# Patient Record
Sex: Female | Born: 1960 | Race: Black or African American | Hispanic: No | Smoking: Never smoker
Health system: Southern US, Community
[De-identification: ages and names within clinical notes are randomized; demographics above are authoritative.]

## PROBLEM LIST (undated history)

## (undated) DIAGNOSIS — M1990 Unspecified osteoarthritis, unspecified site: Secondary | ICD-10-CM

## (undated) DIAGNOSIS — J45909 Unspecified asthma, uncomplicated: Secondary | ICD-10-CM

## (undated) DIAGNOSIS — D649 Anemia, unspecified: Secondary | ICD-10-CM

## (undated) DIAGNOSIS — I8390 Asymptomatic varicose veins of unspecified lower extremity: Secondary | ICD-10-CM

## (undated) DIAGNOSIS — Z889 Allergy status to unspecified drugs, medicaments and biological substances status: Secondary | ICD-10-CM

## (undated) DIAGNOSIS — I10 Essential (primary) hypertension: Secondary | ICD-10-CM

## (undated) HISTORY — PX: APPENDECTOMY: SHX54

## (undated) HISTORY — PX: OTHER SURGICAL HISTORY: SHX169

## (undated) HISTORY — PX: TONSILLECTOMY: SUR1361

---

## 2014-06-14 ENCOUNTER — Other Ambulatory Visit: Payer: Self-pay | Admitting: Orthopedic Surgery

## 2014-06-14 NOTE — Progress Notes (Signed)
Preoperative surgical orders have been place into the [facility] hospital system for Charlynne Pander on 06/14/2014, 1:59 PM  by Patrica Duel for surgery on 07-16-2014.  Preop Total Hip orders including Experel Injecion, IV Tylenol, and IV Decadron as long as there are no contraindications to the above medications. Avel Peace, PA-C

## 2014-07-04 NOTE — Patient Instructions (Signed)
[name]  07/04/2014   Your procedure is scheduled on:07/16/2014     Come into the Patient/visitor parking.  You will see at the bottom of the hill [employer] [facility].  Come thru the [facility] Entrance at 130pm.  Follow the signs to the Short Stay Center.    Call this number if you have problems the morning of surgery: [phone]   Remember:   Do not eat food after midnite.  May have clear liquids unti 0930am then npo.    Take these medicines the morning of surgery with A SIP OF WATER:    Do not wear jewelry, make-up or nail polish.  Do not wear lotions, powders, or perfumes. deodorant.  Do not shave 48 hours prior to surgery.   Do not bring valuables to the hospital.  Contacts, dentures or bridgework may not be worn into surgery.  Leave suitcase in the car. After surgery it may be brought to your room.  For patients admitted to the hospital, checkout time is 11:00 AM the day of  discharge.          Please read over the following fact sheets that you were given: MRSA Information, coughing and deep breathing exercises, leg exercises               CLEAR LIQUID DIET   Foods Allowed                                                                     Foods Excluded  Coffee and tea, regular and decaf                             liquids that you cannot  Plain Jell-O in any flavor                                             see through such as: Fruit ices (not with fruit pulp)                                     milk, soups, orange juice  Iced Popsicles                                    All solid food Carbonated beverages, regular and diet                                    Cranberry, grape and apple juices Sports drinks like Gatorade Lightly seasoned clear broth or consume(fat free) Sugar, honey syrup  Sample Menu Breakfast                                Lunch  Supper Cranberry juice                    Beef broth                             Chicken broth Jell-O                                     Grape juice                           Apple juice Coffee or tea                        Jell-O                                      Popsicle                                                Coffee or tea                        Coffee or tea  _____________________________________________________________________  [facility] - Preparing for Surgery Before surgery, you can play an important role.  Because skin is not sterile, your skin needs to be as free of germs as possible.  You can reduce the number of germs on your skin by washing with CHG (chlorahexidine gluconate) soap before surgery.  CHG is an antiseptic cleaner which kills germs and bonds with the skin to continue killing germs even after washing. Please DO NOT use if you have an allergy to CHG or antibacterial soaps.  If your skin becomes reddened/irritated stop using the CHG and inform your nurse when you arrive at Short Stay. Do not shave (including legs and underarms) for at least 48 hours prior to the first CHG shower.  You may shave your face/neck. Please follow these instructions carefully:  1.  Shower with CHG Soap the night before surgery and the  morning of Surgery.  2.  If you choose to wash your hair, wash your hair first as usual with your  normal  shampoo.  3.  After you shampoo, rinse your hair and body thoroughly to remove the  shampoo.                           4.  Use CHG as you would any other liquid soap.  You can apply chg directly  to the skin and wash                       Gently with a scrungie or clean washcloth.  5.  Apply the CHG Soap to your body ONLY FROM THE NECK DOWN.   Do not use on face/ open                           Wound or open sores. Avoid contact with eyes, ears mouth and genitals (private parts).  Wash face,  Genitals (private parts) with your normal soap.             6.  Wash thoroughly, paying special attention to the area  where your surgery  will be performed.  7.  Thoroughly rinse your body with warm water from the neck down.  8.  DO NOT shower/wash with your normal soap after using and rinsing off  the CHG Soap.                9.  Pat yourself dry with a clean towel.            10.  Wear clean pajamas.            11.  Place clean sheets on your bed the night of your first shower and do not  sleep with pets. Day of Surgery : Do not apply any lotions/deodorants the morning of surgery.  Please wear clean clothes to the hospital/surgery center.  FAILURE TO FOLLOW THESE INSTRUCTIONS MAY RESULT IN THE CANCELLATION OF YOUR SURGERY PATIENT SIGNATURE_________________________________  NURSE SIGNATURE__________________________________  ________________________________________________________________________  WHAT IS A BLOOD TRANSFUSION? Blood Transfusion Information  A transfusion is the replacement of blood or some of its parts. Blood is made up of multiple cells which provide different functions.  Red blood cells carry oxygen and are used for blood loss replacement.  White blood cells fight against infection.  Platelets control bleeding.  Plasma helps clot blood.  Other blood products are available for specialized needs, such as hemophilia or other clotting disorders. BEFORE THE TRANSFUSION  Who gives blood for transfusions?   Healthy volunteers who are fully evaluated to make sure their blood is safe. This is blood bank blood. Transfusion therapy is the safest it has ever been in the practice of medicine. Before blood is taken from a donor, a complete history is taken to make sure that person has no history of diseases nor engages in risky social behavior (examples are intravenous drug use or sexual activity with multiple partners). The donor's travel history is screened to minimize risk of transmitting infections, such as malaria. The donated blood is tested for signs of infectious diseases, such as HIV  and hepatitis. The blood is then tested to be sure it is compatible with you in order to minimize the chance of a transfusion reaction. If you or a relative donates blood, this is often done in anticipation of surgery and is not appropriate for emergency situations. It takes many days to process the donated blood. RISKS AND COMPLICATIONS Although transfusion therapy is very safe and saves many lives, the main dangers of transfusion include:   Getting an infectious disease.  Developing a transfusion reaction. This is an allergic reaction to something in the blood you were given. Every precaution is taken to prevent this. The decision to have a blood transfusion has been considered carefully by your caregiver before blood is given. Blood is not given unless the benefits outweigh the risks. AFTER THE TRANSFUSION  Right after receiving a blood transfusion, you will usually feel much better and more energetic. This is especially true if your red blood cells have gotten low (anemic). The transfusion raises the level of the red blood cells which carry oxygen, and this usually causes an energy increase.  The nurse administering the transfusion will monitor you carefully for complications. HOME CARE INSTRUCTIONS  No special instructions are needed after a transfusion. You may find your energy is better. Speak with your caregiver about any  limitations on activity for underlying diseases you may have. SEEK MEDICAL CARE IF:   Your condition is not improving after your transfusion.  You develop redness or irritation at the intravenous (IV) site. SEEK IMMEDIATE MEDICAL CARE IF:  Any of the following symptoms occur over the next 12 hours:  Shaking chills.  You have a temperature by mouth above 102 F (38.9 C), not controlled by medicine.  Chest, back, or muscle pain.  People around you feel you are not acting correctly or are confused.  Shortness of breath or difficulty breathing.  Dizziness and  fainting.  You get a rash or develop hives.  You have a decrease in urine output.  Your urine turns a dark color or changes to pink, red, or brown. Any of the following symptoms occur over the next 10 days:  You have a temperature by mouth above 102 F (38.9 C), not controlled by medicine.  Shortness of breath.  Weakness after normal activity.  The white part of the eye turns yellow (jaundice).  You have a decrease in the amount of urine or are urinating less often.  Your urine turns a dark color or changes to pink, red, or brown. Document Released: 09/11/2000 Document Revised: 12/07/2011 Document Reviewed: 04/30/2008 ExitCare Patient Information 2014 [place].  _______________________________________________________________________  Incentive Spirometer  An incentive spirometer is a tool that can help keep your lungs clear and active. This tool measures how well you are filling your lungs with each breath. Taking long deep breaths may help reverse or decrease the chance of developing breathing (pulmonary) problems (especially infection) following:  A long period of time when you are unable to move or be active. BEFORE THE PROCEDURE   If the spirometer includes an indicator to show your best effort, your nurse or respiratory therapist will set it to a desired goal.  If possible, sit up straight or lean slightly forward. Try not to slouch.  Hold the incentive spirometer in an upright position. INSTRUCTIONS FOR USE  1. Sit on the edge of your bed if possible, or sit up as far as you can in bed or on a chair. 2. Hold the incentive spirometer in an upright position. 3. Breathe out normally. 4. Place the mouthpiece in your mouth and seal your lips tightly around it. 5. Breathe in slowly and as deeply as possible, raising the piston or the ball toward the top of the column. 6. Hold your breath for 3-5 seconds or for as long as possible. Allow the piston or ball to fall to  the bottom of the column. 7. Remove the mouthpiece from your mouth and breathe out normally. 8. Rest for a few seconds and repeat Steps 1 through 7 at least 10 times every 1-2 hours when you are awake. Take your time and take a few normal breaths between deep breaths. 9. The spirometer may include an indicator to show your best effort. Use the indicator as a goal to work toward during each repetition. 10. After each set of 10 deep breaths, practice coughing to be sure your lungs are clear. If you have an incision (the cut made at the time of surgery), support your incision when coughing by placing a pillow or rolled up towels firmly against it. Once you are able to get out of bed, walk around indoors and cough well. You may stop using the incentive spirometer when instructed by your caregiver.  RISKS AND COMPLICATIONS  Take your time so you do not get dizzy  or light-headed.  If you are in pain, you may need to take or ask for pain medication before doing incentive spirometry. It is harder to take a deep breath if you are having pain. AFTER USE  Rest and breathe slowly and easily.  It can be helpful to keep track of a log of your progress. Your caregiver can provide you with a simple table to help with this. If you are using the spirometer at home, follow these instructions: [place] IF:   You are having difficultly using the spirometer.  You have trouble using the spirometer as often as instructed.  Your pain medication is not giving enough relief while using the spirometer.  You develop fever of 100.5 F (38.1 C) or higher. SEEK IMMEDIATE MEDICAL CARE IF:   You cough up bloody sputum that had not been present before.  You develop fever of 102 F (38.9 C) or greater.  You develop worsening pain at or near the incision site. MAKE SURE YOU:   Understand these instructions.  Will watch your condition.  Will get help right away if you are not doing well or get  worse. Document Released: 01/25/2007 Document Revised: 12/07/2011 Document Reviewed: 03/28/2007 [facility] Patient Information 2014 [place], [place].   ________________________________________________________________________

## 2014-07-05 ENCOUNTER — Inpatient Hospital Stay (HOSPITAL_COMMUNITY)
Admission: RE | Admit: 2014-07-05 | Discharge: 2014-07-05 | Disposition: A | Discharge status: Home / Self Care | Payer: Self-pay | Source: Ambulatory Visit

## 2014-07-11 ENCOUNTER — Encounter (HOSPITAL_COMMUNITY): Payer: Self-pay

## 2014-07-11 ENCOUNTER — Encounter (HOSPITAL_COMMUNITY): Payer: Self-pay | Admitting: Pharmacy Technician

## 2014-07-11 ENCOUNTER — Encounter (INDEPENDENT_AMBULATORY_CARE_PROVIDER_SITE_OTHER): Payer: Self-pay

## 2014-07-11 ENCOUNTER — Ambulatory Visit (HOSPITAL_COMMUNITY)
Admission: RE | Admit: 2014-07-11 | Discharge: 2014-07-11 | Disposition: A | Discharge status: Home / Self Care | Payer: BC Managed Care – PPO | Source: Ambulatory Visit | Attending: Anesthesiology | Admitting: Anesthesiology

## 2014-07-11 ENCOUNTER — Ambulatory Visit (HOSPITAL_COMMUNITY)
Admission: RE | Admit: 2014-07-11 | Discharge: 2014-07-11 | Disposition: A | Discharge status: Home / Self Care | Payer: BC Managed Care – PPO | Source: Ambulatory Visit | Attending: Orthopedic Surgery | Admitting: Orthopedic Surgery

## 2014-07-11 ENCOUNTER — Encounter (HOSPITAL_COMMUNITY)
Admission: RE | Admit: 2014-07-11 | Discharge: 2014-07-11 | Disposition: A | Discharge status: Home / Self Care | Payer: BC Managed Care – PPO | Source: Ambulatory Visit | Attending: Orthopedic Surgery | Admitting: Orthopedic Surgery

## 2014-07-11 DIAGNOSIS — I7090 Unspecified atherosclerosis: Secondary | ICD-10-CM | POA: Insufficient documentation

## 2014-07-11 DIAGNOSIS — Z01818 Encounter for other preprocedural examination: Secondary | ICD-10-CM | POA: Insufficient documentation

## 2014-07-11 DIAGNOSIS — M169 Osteoarthritis of hip, unspecified: Secondary | ICD-10-CM | POA: Diagnosis not present

## 2014-07-11 DIAGNOSIS — I10 Essential (primary) hypertension: Secondary | ICD-10-CM

## 2014-07-11 HISTORY — DX: Asymptomatic varicose veins of unspecified lower extremity: I83.90

## 2014-07-11 HISTORY — DX: Unspecified osteoarthritis, unspecified site: M19.90

## 2014-07-11 HISTORY — DX: Allergy status to unspecified drugs, medicaments and biological substances: Z88.9

## 2014-07-11 HISTORY — DX: Anemia, unspecified: D64.9

## 2014-07-11 HISTORY — DX: Essential (primary) hypertension: I10

## 2014-07-11 LAB — HCG, SERUM, QUALITATIVE: PREG SERUM: NEGATIVE

## 2014-07-11 LAB — CBC
HCT: 33.4 % — ABNORMAL LOW (ref 36.0–46.0)
Hemoglobin: 10.2 g/dL — ABNORMAL LOW (ref 12.0–15.0)
MCH: 27.1 pg (ref 26.0–34.0)
MCHC: 30.5 g/dL (ref 30.0–36.0)
MCV: 88.6 fL (ref 78.0–100.0)
PLATELETS: 404 10*3/uL — AB (ref 150–400)
RBC: 3.77 MIL/uL — AB (ref 3.87–5.11)
RDW: 14.1 % (ref 11.5–15.5)
WBC: 8.1 10*3/uL (ref 4.0–10.5)

## 2014-07-11 LAB — COMPREHENSIVE METABOLIC PANEL
ALT: 10 U/L (ref 0–35)
AST: 22 U/L (ref 0–37)
Albumin: 3.7 g/dL (ref 3.5–5.2)
Alkaline Phosphatase: 70 U/L (ref 39–117)
Anion gap: 12 (ref 5–15)
BUN: 17 mg/dL (ref 6–23)
CALCIUM: 9.5 mg/dL (ref 8.4–10.5)
CO2: 26 meq/L (ref 19–32)
Chloride: 102 mEq/L (ref 96–112)
Creatinine, Ser: 0.88 mg/dL (ref 0.50–1.10)
GFR calc Af Amer: 85 mL/min — ABNORMAL LOW (ref >90)
GFR calc non Af Amer: 73 mL/min — ABNORMAL LOW (ref >90)
Glucose, Bld: 91 mg/dL (ref 70–99)
Potassium: 4 mEq/L (ref 3.7–5.3)
SODIUM: 140 meq/L (ref 137–147)
TOTAL PROTEIN: 7.9 g/dL (ref 6.0–8.3)
Total Bilirubin: 0.3 mg/dL (ref 0.3–1.2)

## 2014-07-11 LAB — URINALYSIS, ROUTINE W REFLEX MICROSCOPIC
Bilirubin Urine: NEGATIVE
Glucose, UA: NEGATIVE mg/dL
KETONES UR: NEGATIVE mg/dL
NITRITE: NEGATIVE
PROTEIN: NEGATIVE mg/dL
Specific Gravity, Urine: 1.017 (ref 1.005–1.030)
Urobilinogen, UA: 0.2 mg/dL (ref 0.0–1.0)
pH: 6 (ref 5.0–8.0)

## 2014-07-11 LAB — PROTIME-INR
INR: 1.11 (ref 0.00–1.49)
Prothrombin Time: 14.4 seconds (ref 11.6–15.2)

## 2014-07-11 LAB — URINE MICROSCOPIC-ADD ON

## 2014-07-11 LAB — APTT: aPTT: 28 seconds (ref 24–37)

## 2014-07-11 LAB — SURGICAL PCR SCREEN
MRSA, PCR: NEGATIVE
STAPHYLOCOCCUS AUREUS: POSITIVE — AB

## 2014-07-11 LAB — ABO/RH: ABO/RH(D): O POS

## 2014-07-11 NOTE — Pre-Procedure Instructions (Signed)
PT'S PCR REPORT AND URINALYSIS REPORTS FAXED TO DR. ALUISIO'S OFFICE.

## 2014-07-11 NOTE — Pre-Procedure Instructions (Signed)
EKG, CXR [facility]

## 2014-07-11 NOTE — Patient Instructions (Addendum)
[facility]  ON:  Monday  10/19  REPORT TO  SHORT STAY CENTER AT:  1:25 PM    DO NOT EAT  ANYTHING AFTER MIDNIGHT THE NIGHT BEFORE YOUR SURGERY.   NO FOOD, NO CHEWING GUM, NO MINTS, NO CANDIES, NO CHEWING TOBACCO. YOU MAY HAVE CLEAR LIQUIDS TO DRINK FROM MIDNIGHT UNTIL 10:30 AM DAY OF YOUR SURGERY - LIKE WATER, SODA, APPLE, GRAPE OR CRANBERRY JUICE.                                                  NOTHING TO DRINK AFTER 10:30 AM THE DAY OF YOUR SURGERY.  PLEASE TAKE THE FOLLOWING MEDICATIONS THE AM OF YOUR SURGERY WITH A FEW SIPS OF WATER:  TRAMADOL FOR PAIN IF NEEDED    DO NOT BRING VALUABLES, MONEY, CREDIT CARDS.  DO NOT WEAR JEWELRY, MAKE-UP, NAIL POLISH AND NO METAL PINS OR CLIPS IN YOUR HAIR. CONTACT LENS, DENTURES / PARTIALS, GLASSES SHOULD NOT BE WORN TO SURGERY AND IN MOST CASES-HEARING AIDS WILL NEED TO BE REMOVED.  BRING YOUR GLASSES CASE, ANY EQUIPMENT NEEDED FOR YOUR CONTACT LENS. FOR PATIENTS ADMITTED TO THE HOSPITAL--CHECK OUT TIME THE DAY OF DISCHARGE IS 11:00 AM.  ALL INPATIENT ROOMS ARE PRIVATE - WITH BATHROOM, TELEPHONE, TELEVISION AND WIFI INTERNET.    PLEASE BE AWARE THAT YOU MAY NEED ADDITIONAL BLOOD DRAWN DAY OF YOUR SURGERY  _______________________________________________________________________   [facility] - Preparing for [facility] Before surgery, you can play an important role.  Because skin is not sterile, your skin needs to be as free of germs as possible.  You can reduce the number of germs on your skin by washing with CHG (chlorahexidine gluconate) soap before surgery.  CHG is an antiseptic cleaner which kills germs and bonds with the skin to continue killing germs even after washing. Please DO NOT use if you have an allergy to CHG or antibacterial soaps.  If your skin becomes reddened/irritated stop using the CHG and inform your nurse when you arrive at Short Stay. Do not shave (including legs and underarms) for  at least 48 hours prior to the first CHG shower.  You may shave your face/neck. Please follow these instructions carefully:  1.  Shower with CHG Soap the night before surgery and the  morning of Surgery.  2.  If you choose to wash your hair, wash your hair first as usual with your  normal  shampoo.  3.  After you shampoo, rinse your hair and body thoroughly to remove the  shampoo.                           4.  Use CHG as you would any other liquid soap.  You can apply chg directly  to the skin and wash                       Gently with a scrungie or clean washcloth.  5.  Apply the CHG Soap to your body ONLY FROM THE NECK DOWN.   Do not use on face/ open                           Wound or open sores. Avoid contact with  eyes, ears mouth and genitals (private parts).                       Wash face,  Genitals (private parts) with your normal soap.             6.  Wash thoroughly, paying special attention to the area where your surgery  will be performed.  7.  Thoroughly rinse your body with warm water from the neck down.  8.  DO NOT shower/wash with your normal soap after using and rinsing off  the CHG Soap.                9.  Pat yourself dry with a clean towel.            10.  Wear clean pajamas.            11.  Place clean sheets on your bed the night of your first shower and do not  sleep with pets. Day of Surgery : Do not apply any lotions/deodorants the morning of surgery.  Please wear clean clothes to the hospital/surgery center.  FAILURE TO FOLLOW THESE INSTRUCTIONS MAY RESULT IN THE CANCELLATION OF YOUR SURGERY PATIENT SIGNATURE_________________________________  NURSE SIGNATURE__________________________________  ________________________________________________________________________   [name]  An incentive spirometer is a tool that can help keep your lungs clear and active. This tool measures how well you are filling your lungs with each breath. Taking long deep  breaths may help reverse or decrease the chance of developing breathing (pulmonary) problems (especially infection) following:  A long period of time when you are unable to move or be active. BEFORE THE PROCEDURE   If the spirometer includes an indicator to show your best effort, your nurse or respiratory therapist will set it to a desired goal.  If possible, sit up straight or lean slightly forward. Try not to slouch.  Hold the incentive spirometer in an upright position. INSTRUCTIONS FOR USE  1. Sit on the edge of your bed if possible, or sit up as far as you can in bed or on a chair. 2. Hold the incentive spirometer in an upright position. 3. Breathe out normally. 4. Place the mouthpiece in your mouth and seal your lips tightly around it. 5. Breathe in slowly and as deeply as possible, raising the piston or the ball toward the top of the column. 6. Hold your breath for 3-5 seconds or for as long as possible. Allow the piston or ball to fall to the bottom of the column. 7. Remove the mouthpiece from your mouth and breathe out normally. 8. Rest for a few seconds and repeat Steps 1 through 7 at least 10 times every 1-2 hours when you are awake. Take your time and take a few normal breaths between deep breaths. 9. The spirometer may include an indicator to show your best effort. Use the indicator as a goal to work toward during each repetition. 10. After each set of 10 deep breaths, practice coughing to be sure your lungs are clear. If you have an incision (the cut made at the time of surgery), support your incision when coughing by placing a pillow or rolled up towels firmly against it. Once you are able to get out of bed, walk around indoors and cough well. You may stop using the incentive spirometer when instructed by your caregiver.  RISKS AND COMPLICATIONS  Take your time so you do not get dizzy or light-headed.  If you are  in pain, you may need to take or ask for pain medication before  doing incentive spirometry. It is harder to take a deep breath if you are having pain. AFTER USE  Rest and breathe slowly and easily.  It can be helpful to keep track of a log of your progress. Your caregiver can provide you with a simple table to help with this. If you are using the spirometer at home, follow these instructions: SEEK MEDICAL CARE IF:   You are having difficultly using the spirometer.  You have trouble using the spirometer as often as instructed.  Your pain medication is not giving enough relief while using the spirometer.  You develop fever of 100.5 F (38.1 C) or higher. SEEK IMMEDIATE MEDICAL CARE IF:   You cough up bloody sputum that had not been present before.  You develop fever of 102 F (38.9 C) or greater.  You develop worsening pain at or near the incision site. MAKE SURE YOU:   Understand these instructions.  Will watch your condition.  Will get help right away if you are not doing well or get worse. Document Released: 01/25/2007 Document Revised: 12/07/2011 Document Reviewed: 03/28/2007 Exit[employer] Patient Information 2014 [employer], [place].   ________________________________________________________________________  WHAT IS A BLOOD TRANSFUSION? Blood Transfusion Information  A transfusion is the replacement of blood or some of its parts. Blood is made up of multiple cells which provide different functions.  Red blood cells carry oxygen and are used for blood loss replacement.  White blood cells fight against infection.  Platelets control bleeding.  Plasma helps clot blood.  Other blood products are available for specialized needs, such as hemophilia or other clotting disorders. BEFORE THE TRANSFUSION  Who gives blood for transfusions?   Healthy volunteers who are fully evaluated to make sure their blood is safe. This is blood bank blood. Transfusion therapy is the safest it has ever been in the practice of medicine. Before blood is taken  from a donor, a complete history is taken to make sure that person has no history of diseases nor engages in risky social behavior (examples are intravenous drug use or sexual activity with multiple partners). The donor's travel history is screened to minimize risk of transmitting infections, such as malaria. The donated blood is tested for signs of infectious diseases, such as HIV and hepatitis. The blood is then tested to be sure it is compatible with you in order to minimize the chance of a transfusion reaction. If you or a relative donates blood, this is often done in anticipation of surgery and is not appropriate for emergency situations. It takes many days to process the donated blood. RISKS AND COMPLICATIONS Although transfusion therapy is very safe and saves many lives, the main dangers of transfusion include:   Getting an infectious disease.  Developing a transfusion reaction. This is an allergic reaction to something in the blood you were given. Every precaution is taken to prevent this. The decision to have a blood transfusion has been considered carefully by your caregiver before blood is given. Blood is not given unless the benefits outweigh the risks. AFTER THE TRANSFUSION  Right after receiving a blood transfusion, you will usually feel much better and more energetic. This is especially true if your red blood cells have gotten low (anemic). The transfusion raises the level of the red blood cells which carry oxygen, and this usually causes an energy increase.  The nurse administering the transfusion will monitor you carefully for complications. HOME CARE  INSTRUCTIONS  No special instructions are needed after a transfusion. You may find your energy is better. Speak with your caregiver about any limitations on activity for underlying diseases you may have. SEEK MEDICAL CARE IF:   Your condition is not improving after your transfusion.  You develop redness or irritation at the  intravenous (IV) site. SEEK IMMEDIATE MEDICAL CARE IF:  Any of the following symptoms occur over the next 12 hours:  Shaking chills.  You have a temperature by mouth above 102 F (38.9 C), not controlled by medicine.  Chest, back, or muscle pain.  People around you feel you are not acting correctly or are confused.  Shortness of breath or difficulty breathing.  Dizziness and fainting.  You get a rash or develop hives.  You have a decrease in urine output.  Your urine turns a dark color or changes to pink, red, or brown. Any of the following symptoms occur over the next 10 days:  You have a temperature by mouth above 102 F (38.9 C), not controlled by medicine.  Shortness of breath.  Weakness after normal activity.  The white part of the eye turns yellow (jaundice).  You have a decrease in the amount of urine or are urinating less often.  Your urine turns a dark color or changes to pink, red, or brown. Document Released: 09/11/2000 Document Revised: 12/07/2011 Document Reviewed: 04/30/2008 [facility] Patient Information 2014 [place].  _______________________________________________________________________

## 2014-07-15 ENCOUNTER — Other Ambulatory Visit: Payer: Self-pay | Admitting: Orthopedic Surgery

## 2014-07-15 NOTE — H&P (Signed)
[name] DOB: 08-08-1961 Married / Language: [place] P[place] / Race: Black or African American, White Female Date of Admission:  07/16/2014 Chief Complaint:  Left Hip Pain History of Present Illness  The patient is a 53 year old female who comes in for a preoperative History and Physical. The patient is scheduled for a left total hip arthroplasty to be performed by Dr. Gus RankinFrank V. Aluisio, MD at [facility] on 07/16/2014. The patient is a 53 year old female who presents with a hip problem. The patient was seen in referral from Dr. [name]. The patient reports left hip problems including pain and stiffness symptoms that have been present for 4 month(s). The symptoms began following a specific injury. The injury occured due to a fall (slipped on a dryer sheet) while the patient was at home. Symptoms reported include hip pain, pain with weightbearing, night pain, stiffness, popping, difficulty bearing weight and difficulty ambulating The patient reports symptoms radiating to the: left thigh. Onset of symptoms was gradual.The symptoms are described as moderate in severity.The patient feels as if their symptoms are does feel they are worsening. Symptoms are exacerbated by movement, squatting and weight bearing. Associated symptoms do not include low back pain. Current treatment includes nonsteroidal anti-inflammatory drugs (Advil) and non-opioid analgesics (Tramadol). Previous workup for this problem has included hip x-rays and hip MRI. Previous treatment for this problem has included corticosteroid injection (she is 4 weeks out from intra-articular injection by Dr. [name], only slightly improved after the injection). The hip is getting progressively worse every day according to the patient. She is hurting at all times. It is limiting what she can and cannot do. She says she cannot walk without severe pain. She is basically just sitting down and not doing much activity. She has had significant limitations  in movement as well as her pain. She has a rapidly progressive osteoarthritis. At this point I would then recommend total hip arthroplasty as she is having considerable discomfort and dysfunction. She is ready to proceed. They have been treated conservatively in the past for the above stated problem and despite conservative measures, they continue to have progressive pain and severe functional limitations and dysfunction. They have failed non-operative management including home exercise, medications. It is felt that they would benefit from undergoing total joint replacement. Risks and benefits of the procedure have been discussed with the patient and they elect to proceed with surgery. There are no active contraindications to surgery such as ongoing infection or rapidly progressive neurological disease.  Allergies Penicillamine *ASSORTED CLASSES* Rash  Problem List/Past Medical Primary osteoarthritis of left hip (M16.12) Anemia Asthma Childhood Bronchitis Past History Hypertension Mild Measles Mumps Chicken Pox  Family History Diabetes Mellitus mother Drug / Alcohol Addiction brother Hypertension mother, sister and grandfather mothers side Cerebrovascular Accident sister Cancer father Congestive Heart Failure grandfather mothers side Osteoporosis mother Heart Disease grandfather mothers side Osteoarthritis mother Rheumatoid Arthritis mother  Social History Tobacco use never smoker Pain Contract no Illicit drug use no Number of flights of stairs before winded 4-5 Marital status married Living situation live with spouse Drug/Alcohol Rehab (Previously) no Exercise does running / walking Current work status working full time Children 3 Drug/Alcohol Rehab (Currently) no Post-Surgical Plans Home with Family  Medication History Advil (200MG  Tablet, Oral) Active. Hydrochlorothiazide (Oral) Specific dose unknown - Active. Cetirizine HCl (10MG   Tablet Chewable, Oral) Active. Fluticasone Propionate Active. Vitamin D (1000UNIT Capsule, Oral) Active. Calcium (500MG  Tablet, Oral) Active. TraMADol HCl (50MG  Tablet, Oral) Active.  Past Surgical History  Appendectomy Cesarean Delivery 3 or more times Tonsillectomy   Review of Systems General Not Present- Chills, Fatigue, Fever, Memory Loss, Night Sweats, Weight Gain and Weight Loss. Skin Not Present- Eczema, Hives, Itching, Lesions and Rash. HEENT Not Present- Dentures, Double Vision, Headache, Hearing Loss, Tinnitus and Visual Loss. Respiratory Not Present- Allergies, Chronic Cough, Coughing up blood, Shortness of breath at rest and Shortness of breath with exertion. Cardiovascular Not Present- Chest Pain, Difficulty Breathing Lying Down, Murmur, Palpitations, Racing/skipping heartbeats and Swelling. Gastrointestinal Present- Diarrhea. Not Present- Abdominal Pain, Bloody Stool, Constipation, Difficulty Swallowing, Heartburn, Jaundice, Loss of appetitie, Nausea and Vomiting. Female Genitourinary Not Present- Blood in Urine, Discharge, Flank Pain, Incontinence, Painful Urination, Urgency, Urinary frequency, Urinary Retention, Urinating at Night and Weak urinary stream. Musculoskeletal Present- Joint Pain. Not Present- Back Pain, Joint Swelling, Morning Stiffness, Muscle Pain, Muscle Weakness and Spasms. Neurological Not Present- Blackout spells, Difficulty with balance, Dizziness, Paralysis, Tremor and Weakness. Psychiatric Not Present- Insomnia.   Vitals  BP: 148/102 (Sitting, Right Arm, Standard)   Physical Exam  General Mental Status -Alert, cooperative and good historian. General Appearance-pleasant, Not in acute distress. Orientation-Oriented X3. Build & Nutrition-Well nourished and Well developed.  Head and Neck Head-normocephalic, atraumatic . Neck Global Assessment - supple, no bruit auscultated on the right, no bruit auscultated on the  left.  Eye Vision-Wears corrective lenses. Pupil - Bilateral-PERR Motion - Bilateral-EOMI.  Chest and Lung Exam Auscultation Breath sounds - clear at anterior chest wall and clear at posterior chest wall. Adventitious sounds - No Adventitious sounds.  Cardiovascular Auscultation Rhythm - Regular rate and rhythm. Heart Sounds - S1 WNL and S2 WNL. Murmurs & Other Heart Sounds - Auscultation of the heart reveals - No Murmurs.  Abdomen Palpation/Percussion Tenderness - Abdomen is non-tender to palpation. Rigidity (guarding) - Abdomen is soft. Auscultation Auscultation of the abdomen reveals - Bowel sounds normal.  Female Genitourinary Note: Not done, not pertinent to present illness   Musculoskeletal Note: On exam, well developed female. She is morbidly obese. She is alert and oriented in no apparent distress. She has active range of motion right hip with 120 flexion, rotation in 30, out 40, and abducted 40 without discomfort. Left hip flexion 90. Minimal internal rotation, about 20 abduction and 20 external rotation. Her knee exam is normal. Pulse, sensation, and motor intact.  RADIOGRAPHS: Radiographs are reviewed from earlier this year. She did have significant joint space narrowing of the hip at that point. We have reviewed her MRI. She has bone on bone change now. I also reviewed the x-ray when she had her last intraarticular injection. It shows bone on bone now.  Assessment & Plan  Primary osteoarthritis of left hip (M16.12) Note:Plan is for a Left Total Hip Replacement by Dr. Lequita HaltAluisio.  Plan is to go home with family.  [facility]  The patient does not have any contraindications and will receive TXA (tranexamic acid) prior to surgery.  Signed electronically by Lauraine RinneAlexzandrew L Perkins, III PA-C

## 2014-07-16 ENCOUNTER — Encounter (HOSPITAL_COMMUNITY): Payer: Self-pay | Admitting: *Deleted

## 2014-07-16 ENCOUNTER — Inpatient Hospital Stay (HOSPITAL_COMMUNITY): Payer: BC Managed Care – PPO

## 2014-07-16 ENCOUNTER — Inpatient Hospital Stay (HOSPITAL_COMMUNITY): Payer: BC Managed Care – PPO | Admitting: Anesthesiology

## 2014-07-16 ENCOUNTER — Encounter (HOSPITAL_COMMUNITY)
Admission: RE | Disposition: A | Discharge status: Skilled Nursing Facility | Payer: Self-pay | Source: Ambulatory Visit | Attending: Orthopedic Surgery

## 2014-07-16 ENCOUNTER — Inpatient Hospital Stay (HOSPITAL_COMMUNITY)
Admission: RE | Admit: 2014-07-16 | Discharge: 2014-07-20 | DRG: 470 | Disposition: A | Discharge status: Skilled Nursing Facility | Payer: BC Managed Care – PPO | Source: Ambulatory Visit | Attending: Orthopedic Surgery | Admitting: Orthopedic Surgery

## 2014-07-16 ENCOUNTER — Encounter (HOSPITAL_COMMUNITY): Payer: BC Managed Care – PPO | Admitting: Anesthesiology

## 2014-07-16 DIAGNOSIS — Z6841 Body Mass Index (BMI) 40.0 and over, adult: Secondary | ICD-10-CM | POA: Diagnosis not present

## 2014-07-16 DIAGNOSIS — M1612 Unilateral primary osteoarthritis, left hip: Principal | ICD-10-CM | POA: Diagnosis present

## 2014-07-16 DIAGNOSIS — I10 Essential (primary) hypertension: Secondary | ICD-10-CM | POA: Diagnosis present

## 2014-07-16 DIAGNOSIS — M169 Osteoarthritis of hip, unspecified: Secondary | ICD-10-CM | POA: Diagnosis present

## 2014-07-16 DIAGNOSIS — M25552 Pain in left hip: Secondary | ICD-10-CM | POA: Diagnosis present

## 2014-07-16 HISTORY — PX: TOTAL HIP ARTHROPLASTY: SHX124

## 2014-07-16 LAB — TYPE AND SCREEN
ABO/RH(D): O POS
ANTIBODY SCREEN: NEGATIVE

## 2014-07-16 SURGERY — ARTHROPLASTY, HIP, TOTAL,POSTERIOR APPROACH
Anesthesia: General | Site: Hip | Laterality: Left

## 2014-07-16 MED ORDER — OXYCODONE HCL 5 MG PO TABS
5.0000 mg | ORAL_TABLET | ORAL | Status: DC | PRN
  Administered 2014-07-16 – 2014-07-18 (×11): 10 mg via ORAL
  Filled 2014-07-16 (×11): qty 2

## 2014-07-16 MED ORDER — ONDANSETRON HCL 4 MG PO TABS: 4.0000 mg | ORAL_TABLET | Freq: Four times a day (QID) | ORAL | Status: DC | PRN

## 2014-07-16 MED ORDER — MUPIROCIN 2 % EX OINT: 1.0000 "application " | TOPICAL_OINTMENT | Freq: Once | CUTANEOUS | Status: DC

## 2014-07-16 MED ORDER — BUPIVACAINE LIPOSOME 1.3 % IJ SUSP
INTRAMUSCULAR | Status: DC | PRN
  Administered 2014-07-16: 20 mL

## 2014-07-16 MED ORDER — METHOCARBAMOL 1000 MG/10ML IJ SOLN
500.0000 mg | Freq: Four times a day (QID) | INTRAMUSCULAR | Status: DC | PRN
  Administered 2014-07-16: 500 mg via INTRAVENOUS
  Filled 2014-07-16: qty 5

## 2014-07-16 MED ORDER — SODIUM CHLORIDE 0.9 % IJ SOLN
INTRAMUSCULAR | Status: AC
  Filled 2014-07-16: qty 10

## 2014-07-16 MED ORDER — ACETAMINOPHEN 650 MG RE SUPP: 650.0000 mg | Freq: Four times a day (QID) | RECTAL | Status: DC | PRN

## 2014-07-16 MED ORDER — BUPIVACAINE HCL (PF) 0.25 % IJ SOLN
INTRAMUSCULAR | Status: AC
  Filled 2014-07-16: qty 30

## 2014-07-16 MED ORDER — HYDROMORPHONE HCL 2 MG/ML IJ SOLN
INTRAMUSCULAR | Status: AC
  Filled 2014-07-16: qty 1

## 2014-07-16 MED ORDER — CHLORHEXIDINE GLUCONATE 4 % EX LIQD: 60.0000 mL | Freq: Once | CUTANEOUS | Status: DC

## 2014-07-16 MED ORDER — RIVAROXABAN 10 MG PO TABS
10.0000 mg | ORAL_TABLET | Freq: Every day | ORAL | Status: DC
  Administered 2014-07-17 – 2014-07-20 (×4): 10 mg via ORAL
  Filled 2014-07-16 (×7): qty 1

## 2014-07-16 MED ORDER — SODIUM CHLORIDE 0.9 % IJ SOLN
INTRAMUSCULAR | Status: AC
  Filled 2014-07-16: qty 50

## 2014-07-16 MED ORDER — HYDROCHLOROTHIAZIDE 12.5 MG PO CAPS
12.5000 mg | ORAL_CAPSULE | Freq: Every day | ORAL | Status: DC
  Administered 2014-07-17 – 2014-07-20 (×3): 12.5 mg via ORAL
  Filled 2014-07-16 (×4): qty 1

## 2014-07-16 MED ORDER — FENTANYL CITRATE 0.05 MG/ML IJ SOLN
INTRAMUSCULAR | Status: AC
  Filled 2014-07-16: qty 2

## 2014-07-16 MED ORDER — MIDAZOLAM HCL 2 MG/2ML IJ SOLN
INTRAMUSCULAR | Status: AC
  Filled 2014-07-16: qty 2

## 2014-07-16 MED ORDER — PHENOL 1.4 % MT LIQD
1.0000 | OROMUCOSAL | Status: DC | PRN
Start: 2014-07-16 — End: 2014-07-20

## 2014-07-16 MED ORDER — DEXAMETHASONE SODIUM PHOSPHATE 10 MG/ML IJ SOLN
INTRAMUSCULAR | Status: DC | PRN
  Administered 2014-07-16: 10 mg via INTRAVENOUS

## 2014-07-16 MED ORDER — SUCCINYLCHOLINE CHLORIDE 20 MG/ML IJ SOLN
INTRAMUSCULAR | Status: DC | PRN
  Administered 2014-07-16: 140 mg via INTRAVENOUS

## 2014-07-16 MED ORDER — PROPOFOL 10 MG/ML IV BOLUS
INTRAVENOUS | Status: DC | PRN
  Administered 2014-07-16: 150 mg via INTRAVENOUS
  Administered 2014-07-16: 50 mg via INTRAVENOUS

## 2014-07-16 MED ORDER — BUPIVACAINE LIPOSOME 1.3 % IJ SUSP
20.0000 mL | Freq: Once | INTRAMUSCULAR | Status: DC
  Filled 2014-07-16: qty 20

## 2014-07-16 MED ORDER — ACETAMINOPHEN 10 MG/ML IV SOLN
1000.0000 mg | Freq: Once | INTRAVENOUS | Status: DC
  Filled 2014-07-16: qty 100

## 2014-07-16 MED ORDER — SODIUM CHLORIDE 0.9 % IV SOLN: INTRAVENOUS | Status: DC

## 2014-07-16 MED ORDER — FLEET ENEMA 7-19 GM/118ML RE ENEM: 1.0000 | ENEMA | Freq: Once | RECTAL | Status: AC | PRN

## 2014-07-16 MED ORDER — ACETAMINOPHEN 325 MG PO TABS
650.0000 mg | ORAL_TABLET | Freq: Four times a day (QID) | ORAL | Status: DC | PRN
Start: 2014-07-17 — End: 2014-07-20

## 2014-07-16 MED ORDER — EPHEDRINE SULFATE 50 MG/ML IJ SOLN
INTRAMUSCULAR | Status: DC | PRN
  Administered 2014-07-16: 10 mg via INTRAVENOUS
  Administered 2014-07-16 (×2): 5 mg via INTRAVENOUS

## 2014-07-16 MED ORDER — HYDROMORPHONE HCL 1 MG/ML IJ SOLN
INTRAMUSCULAR | Status: DC | PRN
  Administered 2014-07-16 (×2): 1 mg via INTRAVENOUS

## 2014-07-16 MED ORDER — MIDAZOLAM HCL 2 MG/2ML IJ SOLN
0.5000 mg | Freq: Once | INTRAMUSCULAR | Status: AC | PRN
  Administered 2014-07-16: 1 mg via INTRAVENOUS

## 2014-07-16 MED ORDER — FERROUS SULFATE 325 (65 FE) MG PO TABS
325.0000 mg | ORAL_TABLET | Freq: Every day | ORAL | Status: DC
  Filled 2014-07-16 (×2): qty 1

## 2014-07-16 MED ORDER — ONDANSETRON HCL 4 MG/2ML IJ SOLN: 4.0000 mg | Freq: Four times a day (QID) | INTRAMUSCULAR | Status: DC | PRN

## 2014-07-16 MED ORDER — DEXAMETHASONE SODIUM PHOSPHATE 10 MG/ML IJ SOLN
INTRAMUSCULAR | Status: AC
Start: 2014-07-16 — End: 2014-07-16
  Filled 2014-07-16: qty 1

## 2014-07-16 MED ORDER — VANCOMYCIN HCL IN DEXTROSE 1-5 GM/200ML-% IV SOLN
1000.0000 mg | Freq: Two times a day (BID) | INTRAVENOUS | Status: AC
  Administered 2014-07-17: 1000 mg via INTRAVENOUS
  Filled 2014-07-16: qty 200

## 2014-07-16 MED ORDER — METOCLOPRAMIDE HCL 10 MG PO TABS: 5.0000 mg | ORAL_TABLET | Freq: Three times a day (TID) | ORAL | Status: DC | PRN

## 2014-07-16 MED ORDER — NEOSTIGMINE METHYLSULFATE 10 MG/10ML IV SOLN
INTRAVENOUS | Status: DC | PRN
  Administered 2014-07-16: 4 mg via INTRAVENOUS

## 2014-07-16 MED ORDER — DEXTROSE 5 % IV SOLN
3.0000 g | INTRAVENOUS | Status: AC
  Administered 2014-07-16: 3 g via INTRAVENOUS
  Filled 2014-07-16: qty 3000

## 2014-07-16 MED ORDER — LORATADINE 10 MG PO TABS
10.0000 mg | ORAL_TABLET | Freq: Every day | ORAL | Status: DC
  Administered 2014-07-17 – 2014-07-20 (×4): 10 mg via ORAL
  Filled 2014-07-16 (×4): qty 1

## 2014-07-16 MED ORDER — TRAMADOL HCL 50 MG PO TABS
50.0000 mg | ORAL_TABLET | Freq: Four times a day (QID) | ORAL | Status: DC | PRN
  Administered 2014-07-18 – 2014-07-19 (×2): 100 mg via ORAL
  Filled 2014-07-16 (×3): qty 2

## 2014-07-16 MED ORDER — 0.9 % SODIUM CHLORIDE (POUR BTL) OPTIME
TOPICAL | Status: DC | PRN
  Administered 2014-07-16: 1000 mL

## 2014-07-16 MED ORDER — ROCURONIUM BROMIDE 100 MG/10ML IV SOLN
INTRAVENOUS | Status: AC
  Filled 2014-07-16: qty 1

## 2014-07-16 MED ORDER — DEXAMETHASONE SODIUM PHOSPHATE 10 MG/ML IJ SOLN: 10.0000 mg | Freq: Once | INTRAMUSCULAR | Status: DC

## 2014-07-16 MED ORDER — DEXAMETHASONE SODIUM PHOSPHATE 10 MG/ML IJ SOLN
10.0000 mg | Freq: Once | INTRAMUSCULAR | Status: AC
  Administered 2014-07-17: 10 mg via INTRAVENOUS
  Filled 2014-07-16: qty 1

## 2014-07-16 MED ORDER — ACETAMINOPHEN 500 MG PO TABS
1000.0000 mg | ORAL_TABLET | Freq: Four times a day (QID) | ORAL | Status: AC
  Administered 2014-07-16 – 2014-07-17 (×4): 1000 mg via ORAL
  Filled 2014-07-16 (×4): qty 2

## 2014-07-16 MED ORDER — NEOSTIGMINE METHYLSULFATE 10 MG/10ML IV SOLN
INTRAVENOUS | Status: AC
  Filled 2014-07-16: qty 1

## 2014-07-16 MED ORDER — METOCLOPRAMIDE HCL 5 MG/ML IJ SOLN
5.0000 mg | Freq: Three times a day (TID) | INTRAMUSCULAR | Status: DC | PRN
Start: 2014-07-16 — End: 2014-07-20

## 2014-07-16 MED ORDER — DEXTROSE-NACL 5-0.9 % IV SOLN
INTRAVENOUS | Status: DC
  Administered 2014-07-16: via INTRAVENOUS

## 2014-07-16 MED ORDER — DOCUSATE SODIUM 100 MG PO CAPS
100.0000 mg | ORAL_CAPSULE | Freq: Two times a day (BID) | ORAL | Status: DC
  Administered 2014-07-17 – 2014-07-20 (×6): 100 mg via ORAL

## 2014-07-16 MED ORDER — MENTHOL 3 MG MT LOZG
1.0000 | LOZENGE | OROMUCOSAL | Status: DC | PRN
Start: 2014-07-16 — End: 2014-07-20
  Filled 2014-07-16: qty 9

## 2014-07-16 MED ORDER — GLYCOPYRROLATE 0.2 MG/ML IJ SOLN
INTRAMUSCULAR | Status: DC | PRN
  Administered 2014-07-16: .6 mg via INTRAVENOUS

## 2014-07-16 MED ORDER — FLUTICASONE PROPIONATE 50 MCG/ACT NA SUSP
1.0000 | Freq: Every evening | NASAL | Status: DC
  Administered 2014-07-17 – 2014-07-20 (×4): 1 via NASAL
  Filled 2014-07-16: qty 16

## 2014-07-16 MED ORDER — ACETAMINOPHEN 10 MG/ML IV SOLN
INTRAVENOUS | Status: DC | PRN
  Administered 2014-07-16: 1000 mg via INTRAVENOUS

## 2014-07-16 MED ORDER — ONDANSETRON HCL 4 MG/2ML IJ SOLN
INTRAMUSCULAR | Status: AC
  Filled 2014-07-16: qty 2

## 2014-07-16 MED ORDER — HYDRALAZINE HCL 20 MG/ML IJ SOLN
INTRAMUSCULAR | Status: AC
  Filled 2014-07-16: qty 1

## 2014-07-16 MED ORDER — DIPHENHYDRAMINE HCL 12.5 MG/5ML PO ELIX: 12.5000 mg | ORAL_SOLUTION | ORAL | Status: DC | PRN

## 2014-07-16 MED ORDER — LACTATED RINGERS IV SOLN: INTRAVENOUS | Status: DC

## 2014-07-16 MED ORDER — MIDAZOLAM HCL 5 MG/5ML IJ SOLN
INTRAMUSCULAR | Status: DC | PRN
  Administered 2014-07-16: 2 mg via INTRAVENOUS

## 2014-07-16 MED ORDER — LACTATED RINGERS IV SOLN
INTRAVENOUS | Status: DC
  Administered 2014-07-16 (×2): via INTRAVENOUS
  Administered 2014-07-16: 1000 mL via INTRAVENOUS

## 2014-07-16 MED ORDER — BISACODYL 10 MG RE SUPP
10.0000 mg | Freq: Every day | RECTAL | Status: DC | PRN
Start: 2014-07-16 — End: 2014-07-20

## 2014-07-16 MED ORDER — MORPHINE SULFATE 2 MG/ML IJ SOLN
1.0000 mg | INTRAMUSCULAR | Status: DC | PRN
  Filled 2014-07-16: qty 1

## 2014-07-16 MED ORDER — LABETALOL HCL 5 MG/ML IV SOLN
INTRAVENOUS | Status: DC | PRN
  Administered 2014-07-16 (×2): 5 mg via INTRAVENOUS

## 2014-07-16 MED ORDER — BUPIVACAINE HCL 0.25 % IJ SOLN
INTRAMUSCULAR | Status: DC | PRN
  Administered 2014-07-16: 20 mL

## 2014-07-16 MED ORDER — HYDROMORPHONE HCL 1 MG/ML IJ SOLN
INTRAMUSCULAR | Status: AC
  Filled 2014-07-16: qty 1

## 2014-07-16 MED ORDER — HYDRALAZINE HCL 20 MG/ML IJ SOLN
INTRAMUSCULAR | Status: DC | PRN
  Administered 2014-07-16: 5 mg via INTRAVENOUS

## 2014-07-16 MED ORDER — GLYCOPYRROLATE 0.2 MG/ML IJ SOLN
INTRAMUSCULAR | Status: AC
  Filled 2014-07-16: qty 3

## 2014-07-16 MED ORDER — METHOCARBAMOL 500 MG PO TABS
500.0000 mg | ORAL_TABLET | Freq: Four times a day (QID) | ORAL | Status: DC | PRN
  Administered 2014-07-17 – 2014-07-20 (×10): 500 mg via ORAL
  Filled 2014-07-16 (×11): qty 1

## 2014-07-16 MED ORDER — KETOROLAC TROMETHAMINE 15 MG/ML IJ SOLN: 7.5000 mg | Freq: Four times a day (QID) | INTRAMUSCULAR | Status: AC | PRN

## 2014-07-16 MED ORDER — TRANEXAMIC ACID 100 MG/ML IV SOLN
1000.0000 mg | INTRAVENOUS | Status: AC
  Administered 2014-07-16: 1000 mg via INTRAVENOUS
  Filled 2014-07-16: qty 10

## 2014-07-16 MED ORDER — PROPOFOL 10 MG/ML IV BOLUS
INTRAVENOUS | Status: AC
  Filled 2014-07-16: qty 20

## 2014-07-16 MED ORDER — POLYETHYLENE GLYCOL 3350 17 G PO PACK: 17.0000 g | PACK | Freq: Every day | ORAL | Status: DC | PRN

## 2014-07-16 MED ORDER — SODIUM CHLORIDE 0.9 % IJ SOLN
INTRAMUSCULAR | Status: DC | PRN
  Administered 2014-07-16: 30 mL

## 2014-07-16 MED ORDER — FENTANYL CITRATE 0.05 MG/ML IJ SOLN
INTRAMUSCULAR | Status: DC | PRN
  Administered 2014-07-16: 100 ug via INTRAVENOUS

## 2014-07-16 MED ORDER — EPHEDRINE SULFATE 50 MG/ML IJ SOLN
INTRAMUSCULAR | Status: AC
  Filled 2014-07-16: qty 1

## 2014-07-16 MED ORDER — FENTANYL CITRATE 0.05 MG/ML IJ SOLN
50.0000 ug | INTRAMUSCULAR | Status: DC | PRN
  Administered 2014-07-16 (×2): 50 ug via INTRAVENOUS

## 2014-07-16 MED ORDER — LABETALOL HCL 5 MG/ML IV SOLN
INTRAVENOUS | Status: AC
  Filled 2014-07-16: qty 4

## 2014-07-16 MED ORDER — ONDANSETRON HCL 4 MG/2ML IJ SOLN
INTRAMUSCULAR | Status: DC | PRN
  Administered 2014-07-16: 4 mg via INTRAVENOUS

## 2014-07-16 MED ORDER — ROCURONIUM BROMIDE 100 MG/10ML IV SOLN
INTRAVENOUS | Status: DC | PRN
  Administered 2014-07-16: 10 mg via INTRAVENOUS
  Administered 2014-07-16: 40 mg via INTRAVENOUS

## 2014-07-16 MED ORDER — HYDROMORPHONE HCL 1 MG/ML IJ SOLN
0.2500 mg | INTRAMUSCULAR | Status: DC | PRN
  Administered 2014-07-16 (×3): 0.25 mg via INTRAVENOUS
  Administered 2014-07-16: 0.5 mg via INTRAVENOUS

## 2014-07-16 SURGICAL SUPPLY — 50 items
BAG ZIPLOCK 12X15 (MISCELLANEOUS) ×2 IMPLANT
BIT DRILL 2.8X128 (BIT) ×2 IMPLANT
BLADE EXTENDED COATED 6.5IN (ELECTRODE) ×2 IMPLANT
BLADE SAW SAG 73X25 THK (BLADE) ×1
BLADE SAW SGTL 73X25 THK (BLADE) ×1 IMPLANT
CAPT HIP PF COP ×2 IMPLANT
DECANTER SPIKE VIAL GLASS SM (MISCELLANEOUS) ×2 IMPLANT
DRAPE INCISE IOBAN 66X45 STRL (DRAPES) ×4 IMPLANT
DRAPE POUCH INSTRU U-SHP 10X18 (DRAPES) ×2 IMPLANT
DRAPE SPLIT 77X100IN (DRAPES) ×4 IMPLANT
DRAPE U-SHAPE 47X51 STRL (DRAPES) ×2 IMPLANT
DRSG ADAPTIC 3X8 NADH LF (GAUZE/BANDAGES/DRESSINGS) ×2 IMPLANT
DRSG MEPILEX BORDER 4X4 (GAUZE/BANDAGES/DRESSINGS) ×2 IMPLANT
DRSG MEPILEX BORDER 4X8 (GAUZE/BANDAGES/DRESSINGS) ×2 IMPLANT
DURAPREP 26ML APPLICATOR (WOUND CARE) ×4 IMPLANT
ELECT REM PT RETURN 9FT ADLT (ELECTROSURGICAL) ×2
ELECTRODE REM PT RTRN 9FT ADLT (ELECTROSURGICAL) ×1 IMPLANT
EVACUATOR 1/8 PVC DRAIN (DRAIN) ×2 IMPLANT
FACESHIELD WRAPAROUND (MASK) ×10 IMPLANT
GAUZE SPONGE 4X4 12PLY STRL (GAUZE/BANDAGES/DRESSINGS) ×2 IMPLANT
GLOVE BIO SURGEON STRL SZ7.5 (GLOVE) ×2 IMPLANT
GLOVE BIO SURGEON STRL SZ8 (GLOVE) ×2 IMPLANT
GLOVE BIOGEL PI IND STRL 8 (GLOVE) ×1 IMPLANT
GLOVE BIOGEL PI INDICATOR 8 (GLOVE) ×1
GLOVE SURG SS PI 6.5 STRL IVOR (GLOVE) ×2 IMPLANT
GOWN STRL REUS W/TWL LRG LVL3 (GOWN DISPOSABLE) ×4 IMPLANT
GOWN STRL REUS W/TWL XL LVL3 (GOWN DISPOSABLE) ×4 IMPLANT
IMMOBILIZER KNEE 20 (SOFTGOODS) ×4 IMPLANT
IMMOBILIZER KNEE 20 THIGH 36 (SOFTGOODS) ×1 IMPLANT
KIT BASIN OR (CUSTOM PROCEDURE TRAY) ×2 IMPLANT
MANIFOLD NEPTUNE II (INSTRUMENTS) ×2 IMPLANT
NDL SAFETY ECLIPSE 18X1.5 (NEEDLE) ×2 IMPLANT
NEEDLE HYPO 18GX1.5 SHARP (NEEDLE) ×2
NS IRRIG 1000ML POUR BTL (IV SOLUTION) ×2 IMPLANT
PACK TOTAL JOINT (CUSTOM PROCEDURE TRAY) ×2 IMPLANT
PADDING CAST COTTON 6X4 STRL (CAST SUPPLIES) IMPLANT
PASSER SUT SWANSON 36MM LOOP (INSTRUMENTS) ×2 IMPLANT
POSITIONER SURGICAL ARM (MISCELLANEOUS) ×2 IMPLANT
STRIP CLOSURE SKIN 1/2X4 (GAUZE/BANDAGES/DRESSINGS) ×4 IMPLANT
SUT ETHIBOND NAB CT1 #1 30IN (SUTURE) ×4 IMPLANT
SUT MNCRL AB 4-0 PS2 18 (SUTURE) ×2 IMPLANT
SUT VIC AB 2-0 CT1 27 (SUTURE) ×3
SUT VIC AB 2-0 CT1 TAPERPNT 27 (SUTURE) ×3 IMPLANT
SUT VLOC 180 0 24IN GS25 (SUTURE) ×2 IMPLANT
SYR 20CC LL (SYRINGE) ×2 IMPLANT
SYR 50ML LL SCALE MARK (SYRINGE) ×2 IMPLANT
TOWEL OR 17X26 10 PK STRL BLUE (TOWEL DISPOSABLE) ×4 IMPLANT
TOWEL OR NON WOVEN STRL DISP B (DISPOSABLE) ×6 IMPLANT
TRAY FOLEY CATH 14FRSI W/METER (CATHETERS) ×2 IMPLANT
WATER STERILE IRR 1500ML POUR (IV SOLUTION) ×4 IMPLANT

## 2014-07-16 NOTE — Op Note (Signed)
Pre-operative diagnosis- Osteoarthritis Left hip  Post-operative diagnosis- Osteoarthritis  Left hip  Procedure-  LeftTotal Hip Arthroplasty  Surgeon- Gus RankinFrank V. Ithiel Liebler, MD  Assistant- Avel Peacerew Perkins, PA-C   Anesthesia  General  EBL- 700 ml  Drain Hemovac   Complication- None  Condition-PACU - hemodynamically stable.   Brief Clinical Note-  Hannah PanderMarie Mcpherson is a 53 y.o. female with end stage arthritis of her left hip with progressively worsening pain and dysfunction. Pain occurs with activity and rest including pain at night. She has tried analgesics, protected weight bearing and rest without benefit. Pain is too severe to attempt physical therapy. Radiographs demonstrate bone on bone arthritis with subchondral cyst formation. She presents now for left THA.  Procedure in detail-   The patient is brought into the operating room and placed on the operating table. After successful administration of General  anesthesia, the patient is placed in the  Right lateral decubitus position with the  Left side up and held in place with the hip positioner. The lower extremity is isolated from the perineum with plastic drapes and time-out is performed by the surgical team. The lower extremity is then prepped and draped in the usual sterile fashion. A posterolateral incision is made with a ten blade through a large layer of subcutaneous tissue to the level of the fascia lata which is incised in line with the skin incision. The sciatic nerve is palpated and protected and the short external rotators and capsule are isolated from the femur. The hip is then dislocated and the center of the femoral head is marked. A trial prosthesis is placed such that the trial head corresponds to the center of the patients' native femoral head. The resection level is marked on the femoral neck and the resection is made with an oscillating saw. The femoral head is removed and femoral retractors placed to gain access to the femoral  canal.      The canal finder is passed into the femoral canal and the canal is thoroughly irrigated with sterile saline to remove the fatty contents. Axial reaming is performed to 11.5  mm, proximal reaming to 16 F  and the sleeve machined to a large. A 16 F large trial sleeve is placed into the proximal femur.      The femur is then retracted anteriorly to gain acetabular exposure. Acetabular retractors are placed and the labrum and osteophytes are removed, Acetabular reaming is performed to 49  mm and a 50  mm Pinnacle acetabular shell is placed in anatomic position with excellent purchase. Additional dome screws were not needed. The permanent 32 mm neutral + 4 Marathon liner is placed into the acetabular shell.      The trial femur is then placed into the femoral canal. The size is 16 x 11  stem with a 36 + 6  neck and a 32 + 6 head with the neck version matching  the patients' native anteversion. The hip is reduced with excellent stability with full extension and full external rotation, 70 degrees flexion with 40 degrees adduction and 90 degrees internal rotation and 90 degrees of flexion with 70 degrees of internal rotation. The operative leg is placed on top of the non-operative leg and the leg lengths are found to be equal. The trials are then removed and the permanent implant of the same size is impacted into the femoral canal. The ceramic femoral head of the same size as the trial is placed and the hip is reduced with the  same stability parameters. The operative leg is again placed on top of the non-operative leg and the leg lengths are found to be equal.      The wound is then copiously irrigated with saline solution and the capsule and short external rotators are re-attached to the femur through drill holes with Ethibond suture. The fascia lata is closed over a hemovac drain with #1 vicryl suture and the fascia lata, gluteal muscles and subcutaneous tissues are injected with Exparel 20ml diluted with  saline 30 ml plus 20 ml of .25% Marcaine. The subcutaneous tissues are closed with multiple layers of #1 V-loc and2-0 vicryl and the subcuticular layer closed with running 4-0 Monocryl. The drain is hooked to suction, incision cleaned and dried, and steri-srips and a bulky sterile dressing applied. The limb is placed into a knee immobilizer and the patient is awakened and transported to recovery in stable condition.      Please note that a surgical assistant was a medical necessity for this procedure in order to perform it in a safe and expeditious manner. The assistant was necessary to provide retraction to the vital neurovascular structures and to retract and position the limb to allow for anatomic placement of the prosthetic components.  Gus RankinFrank V. Dyllan Kats, MD    07/16/2014, 7:07 PM

## 2014-07-16 NOTE — Interval H&P Note (Signed)
History and Physical Interval Note:  07/16/2014 4:25 PM  [name]  has presented today for surgery, with the diagnosis of LEFT HIP OA  The various methods of treatment have been discussed with the patient and family. After consideration of risks, benefits and other options for treatment, the patient has consented to  Procedure(s): LEFT TOTAL HIP ARTHROPLASTY (Left) as a surgical intervention .  The patient's history has been reviewed, patient examined, no change in status, stable for surgery.  I have reviewed the patient's chart and labs.  Questions were answered to the patient's satisfaction.     Loanne DrillingALUISIO,Jamaiyah Pyle V

## 2014-07-16 NOTE — H&P (View-Only) (Signed)
[name] DOB: 08-08-1961 Married / Language: Lenox Ponds[place] / Race: Black or African American, White Female Date of Admission:  07/16/2014 Chief Complaint:  Left Hip Pain History of Present Illness  The patient is a 53 year old female who comes in for a preoperative History and Physical. The patient is scheduled for a left total hip arthroplasty to be performed by Dr. Gus RankinFrank V. Aluisio, MD at [facility] on 07/16/2014. The patient is a 53 year old female who presents with a hip problem. The patient was seen in referral from Dr. [name]. The patient reports left hip problems including pain and stiffness symptoms that have been present for 4 month(s). The symptoms began following a specific injury. The injury occured due to a fall (slipped on a dryer sheet) while the patient was at home. Symptoms reported include hip pain, pain with weightbearing, night pain, stiffness, popping, difficulty bearing weight and difficulty ambulating The patient reports symptoms radiating to the: left thigh. Onset of symptoms was gradual.The symptoms are described as moderate in severity.The patient feels as if their symptoms are does feel they are worsening. Symptoms are exacerbated by movement, squatting and weight bearing. Associated symptoms do not include low back pain. Current treatment includes nonsteroidal anti-inflammatory drugs (Advil) and non-opioid analgesics (Tramadol). Previous workup for this problem has included hip x-rays and hip MRI. Previous treatment for this problem has included corticosteroid injection (she is 4 weeks out from intra-articular injection by Dr. [name], only slightly improved after the injection). The hip is getting progressively worse every day according to the patient. She is hurting at all times. It is limiting what she can and cannot do. She says she cannot walk without severe pain. She is basically just sitting down and not doing much activity. She has had significant limitations  in movement as well as her pain. She has a rapidly progressive osteoarthritis. At this point I would then recommend total hip arthroplasty as she is having considerable discomfort and dysfunction. She is ready to proceed. They have been treated conservatively in the past for the above stated problem and despite conservative measures, they continue to have progressive pain and severe functional limitations and dysfunction. They have failed non-operative management including home exercise, medications. It is felt that they would benefit from undergoing total joint replacement. Risks and benefits of the procedure have been discussed with the patient and they elect to proceed with surgery. There are no active contraindications to surgery such as ongoing infection or rapidly progressive neurological disease.  Allergies Penicillamine *ASSORTED CLASSES* Rash  Problem List/Past Medical Primary osteoarthritis of left hip (M16.12) Anemia Asthma Childhood Bronchitis Past History Hypertension Mild Measles Mumps Chicken Pox  Family History Diabetes Mellitus mother Drug / Alcohol Addiction brother Hypertension mother, sister and grandfather mothers side Cerebrovascular Accident sister Cancer father Congestive Heart Failure grandfather mothers side Osteoporosis mother Heart Disease grandfather mothers side Osteoarthritis mother Rheumatoid Arthritis mother  Social History Tobacco use never smoker Pain Contract no Illicit drug use no Number of flights of stairs before winded 4-5 Marital status married Living situation live with spouse Drug/Alcohol Rehab (Previously) no Exercise does running / walking Current work status working full time Children 3 Drug/Alcohol Rehab (Currently) no Post-Surgical Plans Home with Family  Medication History Advil (200MG  Tablet, Oral) Active. Hydrochlorothiazide (Oral) Specific dose unknown - Active. Cetirizine HCl (10MG   Tablet Chewable, Oral) Active. Fluticasone Propionate Active. Vitamin D (1000UNIT Capsule, Oral) Active. Calcium (500MG  Tablet, Oral) Active. TraMADol HCl (50MG  Tablet, Oral) Active.  Past Surgical History  Appendectomy Cesarean Delivery 3 or more times Tonsillectomy   Review of Systems General Not Present- Chills, Fatigue, Fever, Memory Loss, Night Sweats, Weight Gain and Weight Loss. Skin Not Present- Eczema, Hives, Itching, Lesions and Rash. HEENT Not Present- Dentures, Double Vision, Headache, Hearing Loss, Tinnitus and Visual Loss. Respiratory Not Present- Allergies, Chronic Cough, Coughing up blood, Shortness of breath at rest and Shortness of breath with exertion. Cardiovascular Not Present- Chest Pain, Difficulty Breathing Lying Down, Murmur, Palpitations, Racing/skipping heartbeats and Swelling. Gastrointestinal Present- Diarrhea. Not Present- Abdominal Pain, Bloody Stool, Constipation, Difficulty Swallowing, Heartburn, Jaundice, Loss of appetitie, Nausea and Vomiting. Female Genitourinary Not Present- Blood in Urine, Discharge, Flank Pain, Incontinence, Painful Urination, Urgency, Urinary frequency, Urinary Retention, Urinating at Night and Weak urinary stream. Musculoskeletal Present- Joint Pain. Not Present- Back Pain, Joint Swelling, Morning Stiffness, Muscle Pain, Muscle Weakness and Spasms. Neurological Not Present- Blackout spells, Difficulty with balance, Dizziness, Paralysis, Tremor and Weakness. Psychiatric Not Present- Insomnia.   Vitals  BP: 148/102 (Sitting, Right Arm, Standard)   Physical Exam  General Mental Status -Alert, cooperative and good historian. General Appearance-pleasant, Not in acute distress. Orientation-Oriented X3. Build & Nutrition-Well nourished and Well developed.  Head and Neck Head-normocephalic, atraumatic . Neck Global Assessment - supple, no bruit auscultated on the right, no bruit auscultated on the  left.  Eye Vision-Wears corrective lenses. Pupil - Bilateral-PERR Motion - Bilateral-EOMI.  Chest and Lung Exam Auscultation Breath sounds - clear at anterior chest wall and clear at posterior chest wall. Adventitious sounds - No Adventitious sounds.  Cardiovascular Auscultation Rhythm - Regular rate and rhythm. Heart Sounds - S1 WNL and S2 WNL. Murmurs & Other Heart Sounds - Auscultation of the heart reveals - No Murmurs.  Abdomen Palpation/Percussion Tenderness - Abdomen is non-tender to palpation. Rigidity (guarding) - Abdomen is soft. Auscultation Auscultation of the abdomen reveals - Bowel sounds normal.  Female Genitourinary Note: Not done, not pertinent to present illness   Musculoskeletal Note: On exam, well developed female. She is morbidly obese. She is alert and oriented in no apparent distress. She has active range of motion right hip with 120 flexion, rotation in 30, out 40, and abducted 40 without discomfort. Left hip flexion 90. Minimal internal rotation, about 20 abduction and 20 external rotation. Her knee exam is normal. Pulse, sensation, and motor intact.  RADIOGRAPHS: Radiographs are reviewed from earlier this year. She did have significant joint space narrowing of the hip at that point. We have reviewed her MRI. She has bone on bone change now. I also reviewed the x-ray when she had her last intraarticular injection. It shows bone on bone now.  Assessment & Plan  Primary osteoarthritis of left hip (M16.12) Note:Plan is for a Left Total Hip Replacement by Dr. Aluisio.  Plan is to go home with family.  PCP - [facility]  The patient does not have any contraindications and will receive TXA (tranexamic acid) prior to surgery.  Signed electronically by Garnetta Fedrick L Dayven Linsley, III PA-C 

## 2014-07-16 NOTE — Transfer of Care (Signed)
Immediate Anesthesia Transfer of Care Note  Patient: [name]  Procedure(s) Performed: Procedure(s): LEFT TOTAL HIP ARTHROPLASTY (Left)  Patient Location: PACU  Anesthesia Type:General  Level of Consciousness: awake  Airway & Oxygen Therapy: Patient Spontanous Breathing and Patient connected to face mask oxygen  Post-op Assessment: Report given to PACU RN and Post -op Vital signs reviewed and stable  Post vital signs: Reviewed and stable  Complications: No apparent anesthesia complications

## 2014-07-16 NOTE — Plan of Care (Signed)
Problem: Phase I Progression Outcomes Goal: Dangle or out of bed evening of surgery Outcome: Not Met (add Reason) Too painful

## 2014-07-16 NOTE — Anesthesia Preprocedure Evaluation (Addendum)
Anesthesia Evaluation  Patient identified by MRN, date of birth, ID band Patient awake    Reviewed: Allergy & Precautions, H&P , NPO status , Patient's Chart, lab work & pertinent test results  Airway Mallampati: II TM Distance: >3 FB Neck ROM: full    Dental no notable dental hx. (+) Teeth Intact, Dental Advisory Given   Pulmonary neg pulmonary ROS,  breath sounds clear to auscultation  Pulmonary exam normal       Cardiovascular hypertension, Pt. on medications Rhythm:regular Rate:Normal     Neuro/Psych negative neurological ROS  negative psych ROS   GI/Hepatic negative GI ROS, Neg liver ROS,   Endo/Other  negative endocrine ROSMorbid obesity  Renal/GU negative Renal ROS  negative genitourinary   Musculoskeletal   Abdominal (+) + obese,   Peds  Hematology negative hematology ROS (+) anemia ,   Anesthesia Other Findings   Reproductive/Obstetrics negative OB ROS                          Anesthesia Physical Anesthesia Plan  ASA: III  Anesthesia Plan: General   Post-op Pain Management:    Induction: Intravenous  Airway Management Planned: Oral ETT  Additional Equipment:   Intra-op Plan:   Post-operative Plan: Extubation in OR  Informed Consent: I have reviewed the patients History and Physical, chart, labs and discussed the procedure including the risks, benefits and alternatives for the proposed anesthesia with the patient or authorized representative who has indicated his/her understanding and acceptance.   Dental Advisory Given  Plan Discussed with: CRNA and Surgeon  Anesthesia Plan Comments:         Anesthesia Quick Evaluation

## 2014-07-16 NOTE — Anesthesia Postprocedure Evaluation (Signed)
  Anesthesia Post-op Note  Patient: [name]  Procedure(s) Performed: Procedure(s) (LRB): LEFT TOTAL HIP ARTHROPLASTY (Left)  Patient Location: PACU  Anesthesia Type: General  Level of Consciousness: awake and alert   Airway and Oxygen Therapy: Patient Spontanous Breathing  Post-op Pain: mild  Post-op Assessment: Post-op Vital signs reviewed, Patient's Cardiovascular Status Stable, Respiratory Function Stable, Patent Airway and No signs of Nausea or vomiting  Last Vitals:  Filed Vitals:   07/16/14 2158  BP: 140/70  Pulse: 61  Temp: 36.8 C  Resp: 16    Post-op Vital Signs: stable   Complications: No apparent anesthesia complications

## 2014-07-17 ENCOUNTER — Encounter (HOSPITAL_COMMUNITY): Payer: Self-pay | Admitting: Orthopedic Surgery

## 2014-07-17 LAB — BASIC METABOLIC PANEL
Anion gap: 13 (ref 5–15)
BUN: 12 mg/dL (ref 6–23)
CHLORIDE: 102 meq/L (ref 96–112)
CO2: 27 meq/L (ref 19–32)
Calcium: 9.1 mg/dL (ref 8.4–10.5)
Creatinine, Ser: 0.89 mg/dL (ref 0.50–1.10)
GFR calc Af Amer: 84 mL/min — ABNORMAL LOW (ref >90)
GFR calc non Af Amer: 72 mL/min — ABNORMAL LOW (ref >90)
Glucose, Bld: 136 mg/dL — ABNORMAL HIGH (ref 70–99)
Potassium: 4.4 mEq/L (ref 3.7–5.3)
Sodium: 142 mEq/L (ref 137–147)

## 2014-07-17 LAB — CBC
HCT: 29.4 % — ABNORMAL LOW (ref 36.0–46.0)
Hemoglobin: 9.3 g/dL — ABNORMAL LOW (ref 12.0–15.0)
MCH: 26.9 pg (ref 26.0–34.0)
MCHC: 31.6 g/dL (ref 30.0–36.0)
MCV: 85 fL (ref 78.0–100.0)
PLATELETS: 363 10*3/uL (ref 150–400)
RBC: 3.46 MIL/uL — AB (ref 3.87–5.11)
RDW: 14 % (ref 11.5–15.5)
WBC: 12.4 10*3/uL — AB (ref 4.0–10.5)

## 2014-07-17 MED ORDER — TRAMADOL HCL 50 MG PO TABS: 50.0000 mg | ORAL_TABLET | Freq: Four times a day (QID) | ORAL | Status: DC | PRN

## 2014-07-17 MED ORDER — FERROUS SULFATE 325 (65 FE) MG PO TABS: 325.0000 mg | ORAL_TABLET | Freq: Two times a day (BID) | ORAL | Status: DC

## 2014-07-17 MED ORDER — OXYCODONE HCL 5 MG PO TABS: 5.0000 mg | ORAL_TABLET | ORAL | Status: DC | PRN

## 2014-07-17 MED ORDER — RIVAROXABAN 10 MG PO TABS: 10.0000 mg | ORAL_TABLET | Freq: Every day | ORAL | Status: DC

## 2014-07-17 MED ORDER — FERROUS SULFATE 325 (65 FE) MG PO TABS
325.0000 mg | ORAL_TABLET | Freq: Two times a day (BID) | ORAL | Status: DC
  Administered 2014-07-17 – 2014-07-20 (×6): 325 mg via ORAL
  Filled 2014-07-17 (×8): qty 1

## 2014-07-17 MED ORDER — METHOCARBAMOL 500 MG PO TABS: 500.0000 mg | ORAL_TABLET | Freq: Four times a day (QID) | ORAL | Status: DC | PRN

## 2014-07-17 NOTE — Progress Notes (Signed)
[facility]  [facility] is providing the following services: RW and [employer]  If patient discharges after hours, please call [phone].   Renard HamperLecretia Williamson 07/17/2014, 11:45 AM

## 2014-07-17 NOTE — Progress Notes (Signed)
Utilization review completed.  

## 2014-07-17 NOTE — Discharge Instructions (Addendum)
Dr. Gaynelle Arabian Total Joint Specialist [facility] [address], [place], [place] [address] [phone]   TOTAL HIP REPLACEMENT POSTOPERATIVE DIRECTIONS    Hip Rehabilitation, Guidelines Following Surgery  The results of a hip operation are greatly improved after range of motion and muscle strengthening exercises. Follow all safety measures which are given to protect your hip. If any of these exercises cause increased pain or swelling in your joint, decrease the amount until you are comfortable again. Then slowly increase the exercises. Call your caregiver if you have problems or questions.  HOME CARE INSTRUCTIONS  Most of the following instructions are designed to prevent the dislocation of your new hip.  Remove items at home which could result in a fall. This includes throw rugs or furniture in walking pathways.  Continue medications as instructed at time of discharge.  You may have some home medications which will be placed on hold until you complete the course of blood thinner medication.  You may start showering once you are discharged home but do not submerge the incision under water. Just pat the incision dry and apply a dry gauze dressing on daily. Do not put on socks or shoes without following the instructions of your caregivers.  Sit on high chairs so your hips are not bent more than 90 degrees.  Sit on chairs with arms. Use the chair arms to help push yourself up when arising.  Keep your leg on the side of the operation out in front of you when standing up.  Arrange for the use of a toilet seat elevator so you are not sitting low.  Do not do any exercises or get in any positions that cause your toes to point in (pigeon toed).  Always sleep with a pillow between your legs. Do not lie on your side in sleep with both knees touching the bed.   Walk with walker as instructed.  You may resume a sexual relationship in one month or when given the OK by  your caregiver.  Use walker as long as suggested by your caregivers.  You may put full weight on your legs and walk as much as is comfortable. Avoid periods of inactivity such as sitting longer than an hour when not asleep. This helps prevent blood clots.  You may return to work once you are cleared by Engineer, production.  Do not drive a car for 6 weeks or until released by your surgeon.  Do not drive while taking narcotics.  Wear elastic stockings for three weeks following surgery during the day but you may remove then at night.  Make sure you keep all of your appointments after your operation with all of your doctors and caregivers. You should call the office at the above phone number and make an appointment for approximately two weeks after the date of your surgery. Change the dressing daily and reapply a dry dressing each time. Please pick up a stool softener and laxative for home use as long as you are requiring pain medications.  Continue to use ice on the hip for pain and swelling from surgery. You may notice swelling that will progress down to the foot and ankle.  This is normal after  surgery.  Elevate the leg when you are not up walking on it.   It is important for you to complete the blood thinner medication as prescribed by your doctor.  Continue to use the breathing machine which will help keep your temperature down.  It  is common for your temperature to cycle up and down following surgery, especially at night when you are not up moving around and exerting yourself.  The breathing machine keeps your lungs expanded and your temperature down.  RANGE OF MOTION AND STRENGTHENING EXERCISES  These exercises are designed to help you keep full movement of your hip joint. Follow your caregiver's or physical therapist's instructions. Perform all exercises about fifteen times, three times per day or as directed. Exercise both hips, even if you have had only one joint replacement. These exercises can be  done on a training (exercise) mat, on the floor, on a table or on a bed. Use whatever works the best and is most comfortable for you. Use music or television while you are exercising so that the exercises are a pleasant break in your day. This will make your life better with the exercises acting as a break in routine you can look forward to.  Lying on your back, slowly slide your foot toward your buttocks, raising your knee up off the floor. Then slowly slide your foot back down until your leg is straight again.  Lying on your back spread your legs as far apart as you can without causing discomfort.  Lying on your side, raise your upper leg and foot straight up from the floor as far as is comfortable. Slowly lower the leg and repeat.  Lying on your back, tighten up the muscle in the front of your thigh (quadriceps muscles). You can do this by keeping your leg straight and trying to raise your heel off the floor. This helps strengthen the largest muscle supporting your knee.  Lying on your back, tighten up the muscles of your buttocks both with the legs straight and with the knee bent at a comfortable angle while keeping your heel on the floor.   SKILLED REHAB INSTRUCTIONS: If the patient is transferred to a skilled rehab facility following release from the hospital, a list of the current medications will be sent to the facility for the patient to continue.  When discharged from the skilled rehab facility, please have the facility set up the patient's Home Health Physical Therapy prior to being released. Also, the skilled facility will be responsible for providing the patient with their medications at time of release from the facility to include their pain medication, the muscle relaxants, and their blood thinner medication. If the patient is still at the rehab facility at time of the two week follow up appointment, the skilled rehab facility will also need to assist the patient in arranging follow up  appointment in our office and any transportation needs.  MAKE SURE YOU:  Understand these instructions.  Will watch your condition.  Will get help right away if you are not doing well or get worse.  Pick up stool softner and laxative for home. Do not submerge incision under water. May shower. Continue to use ice for pain and swelling from surgery. Hip precautions.  Total Hip Protocol.  Take Xarelto for two and a half more weeks, then discontinue Xarelto. Once the patient has completed the blood thinner regimen, then take a Baby 81 mg Aspirin daily for three more weeks.   Information on my medicine - XARELTO (Rivaroxaban)  This medication education was reviewed with me or my healthcare representative as part of my discharge preparation.  The pharmacist that spoke with me during my hospital stay was:  Milus GlazierMoran, [facility]  Why was Xarelto prescribed for you? Xarelto was  prescribed for you to reduce the risk of blood clots forming after orthopedic surgery. The medical term for these abnormal blood clots is venous thromboembolism (VTE).  What do you need to know about xarelto ? Take your Xarelto ONCE DAILY at the same time every day. You may take it either with or without food.  If you have difficulty swallowing the tablet whole, you may crush it and mix in applesauce just prior to taking your dose.  Take Xarelto exactly as prescribed by your doctor and DO NOT stop taking Xarelto without talking to the doctor who prescribed the medication.  Stopping without other VTE prevention medication to take the place of Xarelto may increase your risk of developing a clot.  After discharge, you should have regular check-up appointments with your healthcare provider that is prescribing your Xarelto.    What do you do if you miss a dose? If you miss a dose, take it as soon as you remember on the same day then continue your regularly scheduled once daily regimen the next day. Do not take two  doses of Xarelto on the same day.   Important Safety Information A possible side effect of Xarelto is bleeding. You should call your healthcare provider right away if you experience any of the following:   Bleeding from an injury or your nose that does not stop.   Unusual colored urine (red or dark brown) or unusual colored stools (red or black).   Unusual bruising for unknown reasons.   A serious fall or if you hit your head (even if there is no bleeding).  Some medicines may interact with Xarelto and might increase your risk of bleeding while on Xarelto. To help avoid this, consult your healthcare provider or pharmacist prior to using any new prescription or non-prescription medications, including herbals, vitamins, non-steroidal anti-inflammatory drugs (NSAIDs) and supplements.  This website has more information on Xarelto: [email or web address].xarelto.[email or web address].

## 2014-07-17 NOTE — Progress Notes (Signed)
   Subjective: 1 Day Post-Op Procedure(s) (LRB): LEFT TOTAL HIP ARTHROPLASTY (Left) Patient reports pain as moderate and severe last night. Still hurting this morning. Patient seen in rounds with Dr. Lequita HaltAluisio. Patient is having problems with pain in the hip, requiring pain medications We will start therapy today. Family in room today. Plan is to go home with family after hospital stay.  Objective: Vital signs in last 24 hours: Temp:  [97.4 F (36.3 C)-98.3 F (36.8 C)] 98.3 F (36.8 C) (10/20 0547) Pulse Rate:  [61-84] 84 (10/20 0547) Resp:  [14-18] 17 (10/20 0547) BP: (119-150)/(64-104) 136/75 mmHg (10/20 0547) SpO2:  [98 %-100 %] 100 % (10/20 0547) Weight:  [146.058 kg (322 lb)] 146.058 kg (322 lb) (10/19 1420)  Intake/Output from previous day:  Intake/Output Summary (Last 24 hours) at 07/17/14 0820 Last data filed at 07/17/14 16100623  Gross per 24 hour  Intake   3310 ml  Output   3185 ml  Net    125 ml    Intake/Output this shift: UOP 2000 since MN +125 (fluids balanced but may need fluid support if spacing)  Labs:  Recent Labs  07/17/14 0427  HGB 9.3*    Recent Labs  07/17/14 0427  WBC 12.4*  RBC 3.46*  HCT 29.4*  PLT 363    Recent Labs  07/17/14 0427  NA 142  K 4.4  CL 102  CO2 27  BUN 12  CREATININE 0.89  GLUCOSE 136*  CALCIUM 9.1   No results found for this basename: LABPT, INR,  in the last 72 hours  EXAM General - Patient is Alert, Appropriate and Oriented Extremity - Neurovascular intact Sensation intact distally Dorsiflexion/Plantar flexion intact Dressing - dressing C/D/I Motor Function - intact, moving foot and toes well on exam.  Hemovac pulled without difficulty.  Past Medical History  Diagnosis Date  . Anemia   . Hypertension   . Varicose veins   . H/O seasonal allergies   . Arthritis     OA LEFT HIP- SEVERE PAIN    Assessment/Plan: 1 Day Post-Op Procedure(s) (LRB): LEFT TOTAL HIP ARTHROPLASTY (Left) Principal  Problem:   OA (osteoarthritis) of hip  Estimated body mass index is 53.58 kg/(m^2) as calculated from the following:   Height as of this encounter: 5\' 5"  (1.651 m).   Weight as of this encounter: 146.058 kg (322 lb). Advance diet Up with therapy Discharge home with home health and family when stable  DVT Prophylaxis - Xarelto Weight Bearing As Tolerated left Leg D/C Knee Immobilizer Hemovac Pulled Begin Therapy Hip Preacutions Fluids balanced - monitor for symptoms and watch pressures  Avel Peacerew Perkins, PA-C Orthopaedic Surgery 07/17/2014, 8:20 AM

## 2014-07-17 NOTE — Care Management Note (Addendum)
    Page 1 of 2   07/23/2014     12:40:08 PM CARE MANAGEMENT NOTE 07/23/2014  Patient:  [name],[name]   Account Number:  [id number]  Date Initiated:  07/17/2014  Documentation initiated by:  JEFFRIES,SARAH  Subjective/Objective Assessment:   adm: LEFT TOTAL HIP ARTHROPLASTY (Left)     Action/Plan:   discharge planning   Anticipated DC Date:  07/18/2014   Anticipated DC Plan:  SKILLED NURSING FACILITY  In-house referral  Clinical Social Worker      DC Planning Services  CM consult      Choice offered to / List presented to:  C-1 Patient   DME arranged  OTHER - SEE COMMENT      DME agency  [facility]     HH arranged  HH-2 PT      HH agency  [facility]   Status of service:  Completed, signed off Medicare Important Message given?   (If response is "NO", the following Medicare IM given date fields will be blank) Date Medicare IM given:   Medicare IM given by:   Date Additional Medicare IM given:   Additional Medicare IM given by:    Discharge Disposition:  HOME W HOME HEALTH SERVICES  Per UR Regulation:  Reviewed for med. necessity/level of care/duration of stay  If discussed at Long Length of Stay Meetings, dates discussed:    Comments:  [id number]/Rhonda DAvis,RN,BSN,CCM patient has chosen to return home with hhc and dme,  [id number]/Rhonda Davis,RN,BSN,CCM: Plan is for patient to go to [facility] post hospital stay.  07/18/14 14:33 CM called addon of HHOT to [facility] rep, Mary for HPT/OT services.  No other CM needs were communicated. Sarah Jeffries, BSN, [phone].  07/17/14 10:00 CM met with pt in room to offer choice of home health agency.  Pt chooses [facility] to render HHPT. address and contact information verified with pt.  Referral called to [facility] rep, Mary.  DME orders requested for a bari walker and a bari 3n1 (pt is 322lbs).  CM called DME rep, Lecretia to deliver DME to room prior to discharge. No other CM needs were  communicated.  Sarah Jeffries, BSN, CM [phone].   

## 2014-07-17 NOTE — Progress Notes (Signed)
Physical Therapy Treatment Note   07/17/14 1600  PT Visit Information  Last PT Received On 07/17/14  Assistance Needed +2  History of Present Illness Pt is a 53 year old female s/p L THA.  PT Time Calculation  PT Start Time 1537  PT Stop Time 1558  PT Time Calculation (min) 21 min  Subjective Data  Subjective Pt just back to bed from bathroom with RN and preferred not to get OOB again as she will likely need to use bathroom again later.  Pt was agreeable to LE exercises in bed.  Reviewed posterior hip precautions during exercises and positioning in bed due to back pain.  Pt would like to work on bed mobility tomorrow.  Precautions  Precautions Fall;Posterior Hip  Restrictions  Other Position/Activity Restrictions WBAT  Pain Assessment  Pain Assessment 0-10  Pain Score (not rated, pt reported just needed to change positions)  Pain Location back  Pain Descriptors / Indicators Aching;Sore  Pain Intervention(s) Repositioned  Cognition  Arousal/Alertness Awake/alert  Behavior During Therapy WFL for tasks assessed/performed  Overall Cognitive Status Within Functional Limits for tasks assessed  Exercises  Exercises Total Joint  Total Joint Exercises  Ankle Circles/Pumps AROM;Left;15 reps  Quad Sets AROM;Left;15 reps  Heel Slides AAROM;Left;10 reps  Hip ABduction/ADduction AAROM;Left;10 reps  PT - End of Session  Activity Tolerance Patient limited by pain  Patient left in bed;with call bell/phone within reach  PT - Assessment/Plan  PT Plan Current plan remains appropriate  PT Frequency 7X/week  Follow Up Recommendations Home health PT  PT equipment Rolling walker with 5" wheels  PT Goal Progression  Progress towards PT goals Progressing toward goals  PT General Charges  $$ ACUTE PT VISIT 1 Procedure  PT Treatments  $Therapeutic Exercise 8-22 mins   Zenovia JarredKati Nohea Kras, PT, DPT 07/17/2014 Pager: [phone]

## 2014-07-17 NOTE — Evaluation (Signed)
Occupational Therapy Evaluation Patient Details Name: [name] MRN: [id number] DOB: [birth year] Today's Date: 07/17/2014    History of Present Illness Pt is a 53 year old female s/p L THA.   Clinical Impression   Pt was admitted for the above.  She will benefit from skilled OT to increase safety and independence with adls, following THPs (posterior).  Pt was mod I prior to admission and her daughter is pregnant, so pt needs to be mod I.  Supervision level goals are set initially.  Pt needs mod to max A for LB adls at this time and was limited by back pain    Follow Up Recommendations  Home health OT    Equipment Recommendations  3 in 1 bedside comode    Recommendations for Other Services       Precautions / Restrictions Precautions Precautions: Fall;Posterior Hip Precaution Comments: reviewed posterior hip precautions and provided pt with handout Restrictions Other Position/Activity Restrictions: WBAT      Mobility Bed Mobility Overal bed mobility: Needs Assistance Bed Mobility: Supine to Sit     Supine to sit: Mod assist;HOB elevated Sit to supine: Mod assist   General bed mobility comments: assist for LLE; cues for technique and THPs  Transfers Overall transfer level: Needs assistance Equipment used: Rolling walker (2 wheeled) Transfers: Sit to/from Stand Sit to Stand: Min guard         General transfer comment: not tested with OT:  pt needed min guard with PT earlier    Balance                                            ADL Overall ADL's : Needs assistance/impaired             Lower Body Bathing: Moderate assistance;Sit to/from stand       Lower Body Dressing: Maximal assistance;Sit to/from stand                 General ADL Comments: Pt seen earlier to assist NT with bed mobility.  Returned and reviewed AE and THPs with pt.  Demonstrated use of AE.  Pt did not want to practice this session:  uncomfortable due to back  pain.  Pt stood earlier with PT with min guard.  Will return tomorrow to let pt try AE.  She wants to be as independent as possible as daughter is 9 months pregnant.  Pt is also interested in seeing leg lifter.  Pt is able to complete UB adls with set up.  Bathroom transfers were not assessed on this visit.     Vision                     Perception     Praxis      Pertinent Vitals/Pain Pain Assessment: Faces Pain Score: 5  Faces Pain Scale: Hurts even more (pt stated pain OK.Marland Kitchen.grimacing) Pain Location: back Pain Descriptors / Indicators: Aching;Sore Pain Intervention(s): Limited activity within patient's tolerance;Monitored during session (declined requesting pain medication)     Hand Dominance     Extremity/Trunk Assessment Upper Extremity Assessment Upper Extremity Assessment: Overall WFL for tasks assessed          Communication Communication Communication: No difficulties   Cognition Arousal/Alertness: Awake/alert Behavior During Therapy: WFL for tasks assessed/performed Overall Cognitive Status: Within Functional Limits for tasks assessed  General Comments       Exercises       Shoulder Instructions      Home Living Family/patient expects to be discharged to:: Private residence Living Arrangements: Children Available Help at Discharge:  (daughter is 9 months pregnant) Type of Home: House Home Access: Stairs to enter Secretary/administratorntrance Stairs-Number of Steps: 3 Entrance Stairs-Rails: Right;Left Home Layout: One level     Bathroom Shower/Tub: Producer, television/film/videoWalk-in shower   Bathroom Toilet: Standard     Home Equipment: Medical laboratory scientific officerCane - single point          Prior Functioning/Environment Level of Independence: Independent with assistive device(s)             OT Diagnosis: Generalized weakness   OT Problem List: Decreased strength;Decreased activity tolerance;Decreased knowledge of use of DME or AE;Pain;Decreased knowledge of precautions    OT Treatment/Interventions: Self-care/ADL training;DME and/or AE instruction;Patient/family education    OT Goals(Current goals can be found in the care plan section) Acute Rehab OT Goals Patient Stated Goal: needs to be modified independent at home OT Goal Formulation: With patient Time For Goal Achievement: 07/24/14 Potential to Achieve Goals: Good ADL Goals Pt Will Perform Lower Body Bathing: with supervision;with set-up;with adaptive equipment;sit to/from stand Pt Will Perform Lower Body Dressing: with set-up;with supervision;with adaptive equipment;sit to/from stand Pt Will Transfer to Toilet: with supervision;ambulating;bedside commode Pt Will Perform Toileting - Clothing Manipulation and hygiene: with supervision;sit to/from stand Pt Will Perform Tub/Shower Transfer: with min guard assist;Shower transfer;ambulating;3 in 1  OT Frequency: Min 2X/week   Barriers to D/C:            Co-evaluation              End of Session    Activity Tolerance: Patient limited by pain Patient left: in bed;with call bell/phone within reach;with family/visitor present   Time: 1356-1411 OT Time Calculation (min): 15 min Charges:  OT General Charges $OT Visit: 1 Procedure OT Evaluation $Initial OT Evaluation Tier I: 1 Procedure OT Treatments $Self Care/Home Management : 8-22 mins G-Codes:    Arli Bree 07/17/2014, 3:24 PM [name], OTR/L [phone] 07/17/2014

## 2014-07-17 NOTE — Discharge Summary (Signed)
Physician Discharge Summary   Patient ID: [name] MRN: [id number] DOB/AGE: 1961/06/23 53 y.o.  Admit date: 07/16/2014 Discharge date: 07/20/2014  Primary Diagnosis:  Osteoarthritis Left hip  Admission Diagnoses:  Past Medical History  Diagnosis Date  . Anemia   . Hypertension   . Varicose veins   . H/O seasonal allergies   . Arthritis     OA LEFT HIP- SEVERE PAIN   Discharge Diagnoses:   Principal Problem:   OA (osteoarthritis) of hip  Estimated body mass index is 53.58 kg/(m^2) as calculated from the following:   Height as of this encounter: 5' 5" (1.651 m).   Weight as of this encounter: 146.058 kg (322 lb).  Procedure(s) (LRB): LEFT TOTAL HIP ARTHROPLASTY (Left)   Consults: None  HPI: [name] is a 53 y.o. female with end stage arthritis of her left hip with progressively worsening pain and dysfunction. Pain occurs with activity and rest including pain at night. She has tried analgesics, protected weight bearing and rest without benefit. Pain is too severe to attempt physical therapy. Radiographs demonstrate bone on bone arthritis with subchondral cyst formation. She presents now for left THA.  Laboratory Data: Admission on 07/16/2014  Component Date Value Ref Range Status  . WBC 07/17/2014 12.4* 4.0 - 10.5 K/uL Final  . RBC 07/17/2014 3.46* 3.87 - 5.11 MIL/uL Final  . Hemoglobin 07/17/2014 9.3* 12.0 - 15.0 g/dL Final  . HCT 07/17/2014 29.4* 36.0 - 46.0 % Final  . MCV 07/17/2014 85.0  78.0 - 100.0 fL Final  . MCH 07/17/2014 26.9  26.0 - 34.0 pg Final  . MCHC 07/17/2014 31.6  30.0 - 36.0 g/dL Final  . RDW 07/17/2014 14.0  11.5 - 15.5 % Final  . Platelets 07/17/2014 363  150 - 400 K/uL Final  . Sodium 07/17/2014 142  137 - 147 mEq/L Final  . Potassium 07/17/2014 4.4  3.7 - 5.3 mEq/L Final  . Chloride 07/17/2014 102  96 - 112 mEq/L Final  . CO2 07/17/2014 27  19 - 32 mEq/L Final  . Glucose, Bld 07/17/2014 136* 70 - 99 mg/dL Final  . BUN 07/17/2014 12  6 - 23  mg/dL Final  . Creatinine, Ser 07/17/2014 0.89  0.50 - 1.10 mg/dL Final  . Calcium 07/17/2014 9.1  8.4 - 10.5 mg/dL Final  . GFR calc non Af Amer 07/17/2014 72* >90 mL/min Final  . GFR calc Af Amer 07/17/2014 84* >90 mL/min Final   Comment: (NOTE)                          The eGFR has been calculated using the CKD EPI equation.                          This calculation has not been validated in all clinical situations.                          eGFR's persistently <90 mL/min signify possible Chronic Kidney                          Disease.  . Anion gap 07/17/2014 13  5 - 15 Final  . WBC 07/18/2014 14.7* 4.0 - 10.5 K/uL Final  . RBC 07/18/2014 3.50* 3.87 - 5.11 MIL/uL Final  . Hemoglobin 07/18/2014 9.4* 12.0 - 15.0 g/dL Final  . HCT 07/18/2014 29.8* 36.0 -  46.0 % Final  . MCV 07/18/2014 85.1  78.0 - 100.0 fL Final  . MCH 07/18/2014 26.9  26.0 - 34.0 pg Final  . MCHC 07/18/2014 31.5  30.0 - 36.0 g/dL Final  . RDW 07/18/2014 14.1  11.5 - 15.5 % Final  . Platelets 07/18/2014 350  150 - 400 K/uL Final  . Sodium 07/18/2014 138  137 - 147 mEq/L Final  . Potassium 07/18/2014 3.9  3.7 - 5.3 mEq/L Final  . Chloride 07/18/2014 99  96 - 112 mEq/L Final  . CO2 07/18/2014 27  19 - 32 mEq/L Final  . Glucose, Bld 07/18/2014 117* 70 - 99 mg/dL Final  . BUN 07/18/2014 15  6 - 23 mg/dL Final  . Creatinine, Ser 07/18/2014 0.94  0.50 - 1.10 mg/dL Final  . Calcium 07/18/2014 9.3  8.4 - 10.5 mg/dL Final  . GFR calc non Af Amer 07/18/2014 68* >90 mL/min Final  . GFR calc Af Amer 07/18/2014 78* >90 mL/min Final   Comment: (NOTE)                          The eGFR has been calculated using the CKD EPI equation.                          This calculation has not been validated in all clinical situations.                          eGFR's persistently <90 mL/min signify possible Chronic Kidney                          Disease.  . Anion gap 07/18/2014 12  5 - 15 Final  . WBC 07/19/2014 14.3* 4.0 - 10.5 K/uL Final    . RBC 07/19/2014 3.40* 3.87 - 5.11 MIL/uL Final  . Hemoglobin 07/19/2014 9.2* 12.0 - 15.0 g/dL Final  . HCT 07/19/2014 29.0* 36.0 - 46.0 % Final  . MCV 07/19/2014 85.3  78.0 - 100.0 fL Final  . MCH 07/19/2014 27.1  26.0 - 34.0 pg Final  . MCHC 07/19/2014 31.7  30.0 - 36.0 g/dL Final  . RDW 07/19/2014 14.3  11.5 - 15.5 % Final  . Platelets 07/19/2014 338  150 - 400 K/uL Final  Hospital Outpatient Visit on 07/11/2014  Component Date Value Ref Range Status  . MRSA, PCR 07/11/2014 NEGATIVE  NEGATIVE Final  . Staphylococcus aureus 07/11/2014 POSITIVE* NEGATIVE Final   Comment:                                 The Xpert SA Assay (FDA                          approved for NASAL specimens                          in patients over 23 years of age),                          is one component of                          a comprehensive surveillance  program.  Test performance has                          been validated by [facility] for patients greater                          than or equal to 1 year old.                          It is not intended                          to diagnose infection nor to                          guide or monitor treatment.  . Preg, Serum 07/11/2014 NEGATIVE  NEGATIVE Final   Comment:                                 THE SENSITIVITY OF THIS                          METHODOLOGY IS >10 mIU/mL.  . aPTT 07/11/2014 28  24 - 37 seconds Final  . WBC 07/11/2014 8.1  4.0 - 10.5 K/uL Final  . RBC 07/11/2014 3.77* 3.87 - 5.11 MIL/uL Final  . Hemoglobin 07/11/2014 10.2* 12.0 - 15.0 g/dL Final  . HCT 07/11/2014 33.4* 36.0 - 46.0 % Final  . MCV 07/11/2014 88.6  78.0 - 100.0 fL Final  . MCH 07/11/2014 27.1  26.0 - 34.0 pg Final  . MCHC 07/11/2014 30.5  30.0 - 36.0 g/dL Final  . RDW 07/11/2014 14.1  11.5 - 15.5 % Final  . Platelets 07/11/2014 404* 150 - 400 K/uL Final  . Sodium 07/11/2014 140  137 - 147 mEq/L Final  .  Potassium 07/11/2014 4.0  3.7 - 5.3 mEq/L Final  . Chloride 07/11/2014 102  96 - 112 mEq/L Final  . CO2 07/11/2014 26  19 - 32 mEq/L Final  . Glucose, Bld 07/11/2014 91  70 - 99 mg/dL Final  . BUN 07/11/2014 17  6 - 23 mg/dL Final  . Creatinine, Ser 07/11/2014 0.88  0.50 - 1.10 mg/dL Final  . Calcium 07/11/2014 9.5  8.4 - 10.5 mg/dL Final  . Total Protein 07/11/2014 7.9  6.0 - 8.3 g/dL Final  . Albumin 07/11/2014 3.7  3.5 - 5.2 g/dL Final  . AST 07/11/2014 22  0 - 37 U/L Final  . ALT 07/11/2014 10  0 - 35 U/L Final  . Alkaline Phosphatase 07/11/2014 70  39 - 117 U/L Final  . Total Bilirubin 07/11/2014 0.3  0.3 - 1.2 mg/dL Final  . GFR calc non Af Amer 07/11/2014 73* >90 mL/min Final  . GFR calc Af Amer 07/11/2014 85* >90 mL/min Final   Comment: (NOTE)                          The eGFR has been calculated using the CKD EPI equation.                            This calculation has not been validated in all clinical situations.                          eGFR's persistently <90 mL/min signify possible Chronic Kidney                          Disease.  . Anion gap 07/11/2014 12  5 - 15 Final  . Prothrombin Time 07/11/2014 14.4  11.6 - 15.2 seconds Final  . INR 07/11/2014 1.11  0.00 - 1.49 Final  . ABO/RH(D) 07/11/2014 O POS   Final  . Antibody Screen 07/11/2014 NEG   Final  . Sample Expiration 07/11/2014 07/19/2014   Final  . Color, Urine 07/11/2014 YELLOW  YELLOW Final  . APPearance 07/11/2014 CLOUDY* CLEAR Final  . Specific Gravity, Urine 07/11/2014 1.017  1.005 - 1.030 Final  . pH 07/11/2014 6.0  5.0 - 8.0 Final  . Glucose, UA 07/11/2014 NEGATIVE  NEGATIVE mg/dL Final  . Hgb urine dipstick 07/11/2014 MODERATE* NEGATIVE Final  . Bilirubin Urine 07/11/2014 NEGATIVE  NEGATIVE Final  . Ketones, ur 07/11/2014 NEGATIVE  NEGATIVE mg/dL Final  . Protein, ur 07/11/2014 NEGATIVE  NEGATIVE mg/dL Final  . Urobilinogen, UA 07/11/2014 0.2  0.0 - 1.0 mg/dL Final  . Nitrite 07/11/2014 NEGATIVE   NEGATIVE Final  . Leukocytes, UA 07/11/2014 SMALL* NEGATIVE Final  . Squamous Epithelial / LPF 07/11/2014 RARE  RARE Final  . WBC, UA 07/11/2014 3-6  <3 WBC/hpf Final  . RBC / HPF 07/11/2014 3-6  <3 RBC/hpf Final  . Bacteria, UA 07/11/2014 FEW* RARE Final  . ABO/RH(D) 07/11/2014 O POS   Final     X-Rays:Dg Chest 2 View  07/11/2014   CLINICAL DATA:  53 year old asymptomatic female under preoperative evaluation prior to hip surgery.  EXAM: CHEST  2 VIEW  COMPARISON:  No priors.  FINDINGS: Lung volumes are normal. No consolidative airspace disease. No pleural effusions. No pneumothorax. No pulmonary nodule or mass noted. Pulmonary vasculature and the cardiomediastinal silhouette are within normal limits. Atherosclerosis in the thoracic aorta.  IMPRESSION: 1.  No radiographic evidence of acute cardiopulmonary disease. 2. Atherosclerosis.   Electronically Signed   By: Vinnie Langton M.D.   On: 07/11/2014 15:19   Dg Hip Complete Left  07/11/2014   CLINICAL DATA:  Preop left total hip arthroplasty  EXAM: LEFT HIP - COMPLETE 2+ VIEW  COMPARISON:  02/21/2014  FINDINGS: Severe left hip osteoarthritis with severe superior joint space narrowing which has progressed compared with 02/21/2014.  There is no acute fracture or dislocation.  The right hip is unremarkable.  There is bilateral asymmetric sacroiliitis.  IMPRESSION: 1. Severe left hip osteoarthritis with severe superior joint space narrowing. 2. Bilateral asymmetric sacroiliitis. Differential diagnosis includes osteoarthritis, rheumatoid arthritis, psoriatic arthritis, gout.   Electronically Signed   By: Kathreen Devoid   On: 07/11/2014 15:22   Dg Pelvis Portable  07/16/2014   CLINICAL DATA:  53 year old female status post left total hip arthroplasty. Initial encounter.  EXAM: PORTABLE PELVIS 1-2 VIEWS  COMPARISON:  01/23/2014.  FINDINGS: Postop portable AP views of the pelvis at 2002 hrs. Sequelae of left bipolar hip arthroplasty. Hardware components  appear intact and normally aligned on this AP view. Elsewhere the pelvis appears stable, including sclerosis of the superior acetabulum and along both SI joints.  IMPRESSION: Bipolar left hip arthroplasty with no adverse features identified.   Electronically Signed   By: Truman Hayward  Hall M.D.   On: 07/16/2014 20:33    EKG: Orders placed during the hospital encounter of 07/11/14  . EKG 12-LEAD  . EKG 12-LEAD     Hospital Course: Patient was admitted to [facility] and taken to the OR and underwent the above state procedure without complications.  Patient tolerated the procedure well and was later transferred to the recovery room and then to the orthopaedic floor for postoperative care.  They were given PO and IV analgesics for pain control following their surgery.  They were given 24 hours of postoperative antibiotics of  Anti-infectives   Start     Dose/Rate Route Frequency Ordered Stop   07/17/14 0600  vancomycin (VANCOCIN) IVPB 1000 mg/200 mL premix     1,000 mg 200 mL/hr over 60 Minutes Intravenous Every 12 hours 07/16/14 2104 07/17/14 0615   07/16/14 1500  ceFAZolin (ANCEF) 3 g in dextrose 5 % 50 mL IVPB     3 g 160 mL/hr over 30 Minutes Intravenous 30 min pre-op 07/16/14 1430 07/16/14 1708     and started on DVT prophylaxis in the form of Xarelto.   PT and OT were ordered for total hip protocol.  The patient was allowed to be WBAT with therapy. Discharge planning was consulted to help with postop disposition and equipment needs.  Patient had a rough night on the evening of surgery due to pain.  They started to get up OOB with therapy on day one and did well with therapy walking 50 feet.  Hemovac drain was pulled without difficulty.  The knee immobilizer was removed and discontinued.  She was unable to progess with therapy on day two due to increased pain only walking about 10 feet.  Dressing was changed on day two and the incision was healing well. Patient was seen in rounds on day three.   Patient reports pain as mild and moderate.  Patient was having problems with pain in the hip, requiring pain medications.  Therapy notes: Pt unable to tolerate ambulation today due to increased pain L hip.  Dr. Aluisio discussed with her that she will need SNF for inpatient rehab.  Social worker got involved to assist with placement. Patient seen on day four. Patient was having problems with pain in the hip, requiring pain medications but was slowly getting better.  She was not ready to go home so the Social Worker got involved to assist with placement of the patient.  Bed was noted to be found at [facility] but waiting on insurance. Hopefully, that will come today, Friday 07/20/2014 and she will be able to over the skilled rehab this afternoon. If not, she will likely remain in the hospital through the weekend and will either go to SNF on Monday if approved or home if continues to make strides and becomes independent enough to go home.. She states that she does not have a lot of help at home and she will need to be more active than she is now. Patient is ready to go to the SNF at this time ([facility]) if insurance comes through later today.  Diet: Cardiac diet Activity:WBAT No bending hip over 90 degrees- A "L" Angle Do not cross legs Do not let foot roll inward When turning these patients a pillow should be placed between the patient's legs to prevent crossing. Patients should have the affected knee fully extended when trying to sit or stand from all surfaces to prevent excessive hip flexion. When ambulating and turning toward   the affected side the affected leg should have the toes turned out prior to moving the walker and the rest of patient's body as to prevent internal rotation/ turning in of the leg. Abduction pillows are the most effective way to prevent a patient from not crossing legs or turning toes in at rest. If an abduction pillow is not ordered placing a regular pillow length  wise between the patient's legs is also an effective reminder. It is imperative that these precautions be maintained so that the surgical hip does not dislocate. Follow-up:  in 2 weeks Disposition - Skilled nursing facility Discharged Condition: stable, Transfer if insurance approval comes thru       Discharge Instructions   Call MD / Call 911    Complete by:  As directed   If you experience chest pain or shortness of breath, CALL 911 and be transported to the hospital emergency room.  If you develope a fever above 101 F, pus (white drainage) or increased drainage or redness at the wound, or calf pain, call your surgeon's office.     Change dressing    Complete by:  As directed   You may change your dressing dressing daily with sterile 4 x 4 inch gauze dressing and paper tape.  Do not submerge the incision under water.     Constipation Prevention    Complete by:  As directed   Drink plenty of fluids.  Prune juice may be helpful.  You may use a stool softener, such as Colace (over the counter) 100 mg twice a day.  Use MiraLax (over the counter) for constipation as needed.     Diet - low sodium heart healthy    Complete by:  As directed      Discharge instructions    Complete by:  As directed   Pick up stool softner and laxative for home. Do not submerge incision under water. May shower. Continue to use ice for pain and swelling from surgery. Hip precautions.  Total Hip Protocol.  Take Xarelto for two and a half more weeks, then discontinue Xarelto. Once the patient has completed the blood thinner regimen, then take a Baby 81 mg Aspirin daily for three more weeks.     Do not sit on low chairs, stoools or toilet seats, as it may be difficult to get up from low surfaces    Complete by:  As directed      Driving restrictions    Complete by:  As directed   No driving until released by the physician.     Follow the hip precautions as taught in Physical Therapy    Complete by:  As  directed      Increase activity slowly as tolerated    Complete by:  As directed      Lifting restrictions    Complete by:  As directed   No lifting until released by the physician.     Patient may shower    Complete by:  As directed   You may shower without a dressing once there is no drainage.  Do not wash over the wound.  If drainage remains, do not shower until drainage stops.     TED hose    Complete by:  As directed   Use stockings (TED hose) for 3 weeks on both leg(s).  You may remove them at night for sleeping.     Weight bearing as tolerated    Complete by:  As directed               Medication List    STOP taking these medications       CALCIUM PO     ibuprofen 800 MG tablet  Commonly known as:  ADVIL,MOTRIN     Vitamin D (Ergocalciferol) 50000 UNITS Caps capsule  Commonly known as:  DRISDOL      TAKE these medications       cetirizine 10 MG tablet  Commonly known as:  ZYRTEC  Take 10 mg by mouth every evening.     docusate sodium 100 MG capsule  Commonly known as:  COLACE  Take 100 mg by mouth at bedtime.     ferrous sulfate 325 (65 FE) MG tablet  Take 1 tablet (325 mg total) by mouth 2 (two) times daily with a meal.     fluticasone 50 MCG/ACT nasal spray  Commonly known as:  FLONASE  Place 1 spray into both nostrils every evening.     hydrochlorothiazide 12.5 MG capsule  Commonly known as:  MICROZIDE  Take 12.5 mg by mouth every morning.     methocarbamol 500 MG tablet  Commonly known as:  ROBAXIN  Take 1 tablet (500 mg total) by mouth every 6 (six) hours as needed for muscle spasms.     Olopatadine HCl 0.6 % Soln  Place 1 each into the nose at bedtime.     oxyCODONE 5 MG immediate release tablet  Commonly known as:  Oxy IR/ROXICODONE  Take 1-3 tablets (5-15 mg total) by mouth every 3 (three) hours as needed for moderate pain, severe pain or breakthrough pain.     rivaroxaban 10 MG Tabs tablet  Commonly known as:  XARELTO  - Take 1 tablet  (10 mg total) by mouth daily with breakfast. Take Xarelto for two and a half more weeks, then discontinue Xarelto.  - Once the patient has completed the blood thinner regimen, then take a Baby 81 mg Aspirin daily for three more weeks.     traMADol 50 MG tablet  Commonly known as:  ULTRAM  Take 1-2 tablets (50-100 mg total) by mouth every 6 (six) hours as needed (mild pain).       Follow-up Information   Follow up with [facility]. (home health physical and occupational therapy)    Contact information:   [address] [place] [address] [phone]       Follow up with [facility]. (rolling walker and 3n1 (commode))    Contact information:   [address] [place] [address] [phone]       Follow up with ALUISIO,FRANK V, MD. Schedule an appointment as soon as possible for a visit on 07/31/2014. (Call [phone] tomorrow to make the appointment)    Specialty:  Orthopedic Surgery   Contact information:   [address] [place] [address] [phone]       Signed: Drew Perkins, PA-C Orthopaedic Surgery 07/20/2014, 12:35 PM    

## 2014-07-17 NOTE — Evaluation (Signed)
Physical Therapy Evaluation Patient Details Name: Hannah PanderMarie Mcpherson MRN: [id number] DOB: Mar 14, 1961 Today's Date: 07/17/2014   History of Present Illness  Pt is a 53 year old female s/p L THA.  Clinical Impression  Pt is s/p LTHA with posterior hip precautions resulting in the deficits listed below (see PT Problem List).  Pt will benefit from skilled PT to increase their independence and safety with mobility to allow discharge to the venue listed below.  Pt with increased L hip pain during mobility requiring assist for bed mobility and increased time for all transfers and gait.  Pt very motivated as she reports she needs to be able to "do things on her own" once home as daughter is pregnant.       Follow Up Recommendations Home health PT    Equipment Recommendations  Rolling walker with 5" wheels (wide)    Recommendations for Other Services       Precautions / Restrictions Precautions Precautions: Fall;Posterior Hip Precaution Comments: reviewed posterior hip precautions and provided pt with handout Restrictions Other Position/Activity Restrictions: WBAT      Mobility  Bed Mobility Overal bed mobility: Needs Assistance Bed Mobility: Supine to Sit     Supine to sit: Mod assist;HOB elevated     General bed mobility comments: assist for L LE and scooting to EOB with bed pad, elevated HOB to assist with trunk a little, pt with increased pain and required increased time  Transfers Overall transfer level: Needs assistance Equipment used: Rolling walker (2 wheeled) Transfers: Sit to/from Stand Sit to Stand: Min guard         General transfer comment: verbal cues for UE and LE positioning and maintaining hip precautions  Ambulation/Gait Ambulation/Gait assistance: Min guard;Min assist Ambulation Distance (Feet): 50 Feet Assistive device: Rolling walker (2 wheeled) Gait Pattern/deviations: Step-to pattern;Antalgic;Decreased stance time - left;Decreased weight shift to  left;Trunk flexed     General Gait Details: verbal cues for sequence, step length, RW positioning, posture, and hip precautions, pt very motivated and wished to continue despite pain rated 5/10, recliner following for safety  Stairs            Wheelchair Mobility    Modified Rankin (Stroke Patients Only)       Balance                                             Pertinent Vitals/Pain Pain Assessment: 0-10 Pain Score: 5  Pain Location: L hip Pain Descriptors / Indicators: Sore;Aching;Burning Pain Intervention(s): Limited activity within patient's tolerance;Monitored during session;Patient requesting pain meds-RN notified;Ice applied;Repositioned    Home Living Family/patient expects to be discharged to:: Private residence Living Arrangements: Children (daughter (pregnant))   Type of Home: House Home Access: Stairs to enter Entrance Stairs-Rails: Doctor, general practiceight;Left Entrance Stairs-Number of Steps: 3 Home Layout: One level Home Equipment: Cane - single point      Prior Function Level of Independence: Independent with assistive device(s)               Hand Dominance        Extremity/Trunk Assessment               Lower Extremity Assessment: LLE deficits/detail   LLE Deficits / Details: required assist for bed mobility, decreased movement due to pain, able to move ankle     Communication   Communication: No difficulties  Cognition  Arousal/Alertness: Awake/alert Behavior During Therapy: WFL for tasks assessed/performed Overall Cognitive Status: Within Functional Limits for tasks assessed                      General Comments      Exercises        Assessment/Plan    PT Assessment Patient needs continued PT services  PT Diagnosis Difficulty walking;Acute pain   PT Problem List Decreased strength;Decreased range of motion;Decreased mobility;Pain;Decreased knowledge of precautions;Decreased knowledge of use of DME  PT  Treatment Interventions Functional mobility training;Stair training;Gait training;DME instruction;Patient/family education;Therapeutic activities;Therapeutic exercise   PT Goals (Current goals can be found in the Care Plan section) Acute Rehab PT Goals Patient Stated Goal: needs to be modified independent at home PT Goal Formulation: With patient Time For Goal Achievement: 07/24/14 Potential to Achieve Goals: Good    Frequency 7X/week   Barriers to discharge        Co-evaluation               End of Session   Activity Tolerance: Patient limited by pain Patient left: in chair;with call bell/phone within reach;with family/visitor present Nurse Communication: Patient requests pain meds         Time: [id number] PT Time Calculation (min): 27 min   Charges:   PT Evaluation $Initial PT Evaluation Tier I: 1 Procedure PT Treatments $Gait Training: 8-22 mins $Therapeutic Activity: 8-22 mins   PT G Codes:          Asli Tokarski,KATHrine E 07/17/2014, 12:56 PM Zenovia JarredKati Paislee Szatkowski, PT, DPT 07/17/2014 Pager: [phone]

## 2014-07-18 LAB — BASIC METABOLIC PANEL
Anion gap: 12 (ref 5–15)
BUN: 15 mg/dL (ref 6–23)
CALCIUM: 9.3 mg/dL (ref 8.4–10.5)
CO2: 27 mEq/L (ref 19–32)
CREATININE: 0.94 mg/dL (ref 0.50–1.10)
Chloride: 99 mEq/L (ref 96–112)
GFR, EST AFRICAN AMERICAN: 78 mL/min — AB (ref >90)
GFR, EST NON AFRICAN AMERICAN: 68 mL/min — AB (ref >90)
GLUCOSE: 117 mg/dL — AB (ref 70–99)
Potassium: 3.9 mEq/L (ref 3.7–5.3)
Sodium: 138 mEq/L (ref 137–147)

## 2014-07-18 LAB — CBC
HEMATOCRIT: 29.8 % — AB (ref 36.0–46.0)
HEMOGLOBIN: 9.4 g/dL — AB (ref 12.0–15.0)
MCH: 26.9 pg (ref 26.0–34.0)
MCHC: 31.5 g/dL (ref 30.0–36.0)
MCV: 85.1 fL (ref 78.0–100.0)
Platelets: 350 10*3/uL (ref 150–400)
RBC: 3.5 MIL/uL — ABNORMAL LOW (ref 3.87–5.11)
RDW: 14.1 % (ref 11.5–15.5)
WBC: 14.7 10*3/uL — ABNORMAL HIGH (ref 4.0–10.5)

## 2014-07-18 MED ORDER — OXYCODONE HCL 5 MG PO TABS
5.0000 mg | ORAL_TABLET | ORAL | Status: DC | PRN
  Administered 2014-07-18 (×2): 10 mg via ORAL
  Administered 2014-07-19 (×3): 15 mg via ORAL
  Administered 2014-07-19 (×2): 10 mg via ORAL
  Administered 2014-07-20 (×5): 15 mg via ORAL
  Filled 2014-07-18: qty 1
  Filled 2014-07-18 (×2): qty 3
  Filled 2014-07-18: qty 2
  Filled 2014-07-18: qty 3
  Filled 2014-07-18: qty 2
  Filled 2014-07-18: qty 3
  Filled 2014-07-18: qty 2
  Filled 2014-07-18 (×4): qty 3
  Filled 2014-07-18: qty 2

## 2014-07-18 NOTE — Progress Notes (Signed)
Physical Therapy Treatment Note   07/18/14 1500  PT Visit Information  Last PT Received On 07/18/14  Assistance Needed +2  History of Present Illness Pt is a 53 year old female s/p L THA.  PT Time Calculation  PT Start Time 1518  PT Stop Time 1545  PT Time Calculation (min) 27 min  Subjective Data  Subjective [facility] with bed mobility and [facility]  Pt premedicated for session however continues to be very limited by pain.  Precautions  Precautions Fall;Posterior Hip  Restrictions  Other Position/Activity Restrictions WBAT  Pain Assessment  Pain Assessment 0-10  Pain Score 10  Pain Location Lhip/thigh  Pain Descriptors / Indicators Crying;Moaning;Sore  Pain Intervention(s) Limited activity within patient's tolerance;Repositioned;Monitored during session;Premedicated before session  Cognition  Arousal/Alertness Awake/alert  Behavior During Therapy WFL for tasks assessed/performed  Overall Cognitive Status Within Functional Limits for tasks assessed  Bed Mobility  Overal bed mobility Needs Assistance  Bed Mobility Supine to Sit;Sit to Supine  Supine to sit Mod assist  Sit to supine Mod assist  General bed mobility comments assist for L LE, pt attempted to assist off bed with sheet however still required assist due to pain, verbal cues for precautions, increased time and effort due to pain, bil LEs assisted onto bed  Transfers  Overall transfer level Needs assistance  Equipment used Rolling walker (2 wheeled)  Transfers Sit to/from BJ'sStand;Stand Pivot Transfers  Sit to Stand Min assist  Stand pivot transfers Min assist  General transfer comment verbal cues for UE and LE positioning  PT - End of Session  Activity Tolerance Patient limited by pain  Patient left in bed;with call bell/phone within reach  PT - Assessment/Plan  PT Plan Current plan remains appropriate  PT Frequency 7X/week  Follow Up Recommendations Home health PT  PT equipment Rolling walker with 5" wheels   PT Goal Progression  Progress towards PT goals Progressing toward goals (very slowly, limited by pain today)  PT General Charges  $$ ACUTE PT VISIT 1 Procedure  PT Treatments  $Therapeutic Activity 23-37 mins   Zenovia JarredKati Midori Dado, PT, DPT 07/18/2014 Pager: [phone]

## 2014-07-18 NOTE — Progress Notes (Signed)
Physical Therapy Treatment Patient Details Name: [name] MRN: [id number] DOB: Jul 15, 1961 Today's Date: 07/18/2014    History of Present Illness Pt is a 53 year old female s/p L THA.    PT Comments    Pt unable to tolerate ambulation today due to increased pain L hip.  RN notified of pain, pt repositioned, and ice applied.  Pt continues to remain motivated to progress activity however discouraged about pain today.    Follow Up Recommendations  Home health PT     Equipment Recommendations  Rolling walker with 5" wheels    Recommendations for Other Services       Precautions / Restrictions Precautions Precautions: Fall;Posterior Hip Restrictions Other Position/Activity Restrictions: WBAT    Mobility  Bed Mobility                  Transfers Overall transfer level: Needs assistance Equipment used: Rolling walker (2 wheeled) Transfers: Sit to/from Stand Sit to Stand: Mod assist         General transfer comment: standing after bathing with nsg tech upon arrival, required mod assist to control descent and place L LE due to pain  Ambulation/Gait Ambulation/Gait assistance: Min guard;+2 safety/equipment Ambulation Distance (Feet): 10 Feet Assistive device: Rolling walker (2 wheeled) Gait Pattern/deviations: Step-to pattern;Antalgic;Decreased stance time - left     General Gait Details: pt very limited by pain, increased difficulty advancing L LE today, pt crying in pain so therapist limited distance, pt ambulated 5 feet and provided recliner however pt wished to ambulate 5 feet back to starting position   Stairs            Wheelchair Mobility    Modified Rankin (Stroke Patients Only)       Balance                                    Cognition Arousal/Alertness: Awake/alert Behavior During Therapy: WFL for tasks assessed/performed Overall Cognitive Status: Within Functional Limits for tasks assessed                      Exercises      General Comments        Pertinent Vitals/Pain Pain Assessment: 0-10 Pain Score: 10-Worst pain ever Pain Location: L hip Pain Descriptors / Indicators: Sore;Grimacing;Crying;Stabbing Pain Intervention(s): Limited activity within patient's tolerance;Monitored during session;Repositioned;Patient requesting pain meds-RN notified;Ice applied    Home Living                      Prior Function            PT Goals (current goals can now be found in the care plan section) Progress towards PT goals: Progressing toward goals    Frequency  7X/week    PT Plan Current plan remains appropriate    Co-evaluation             End of Session   Activity Tolerance: Patient limited by pain Patient left: in chair;with call bell/phone within reach;with nursing/sitter in room     Time: 1129-1139 PT Time Calculation (min): 10 min  Charges:  $Gait Training: 8-22 mins                    G Codes:      [name],KATHrine E 07/18/2014, 12:51 PM Zenovia JarredKati Salome Hautala, PT, DPT 07/18/2014 Pager: [phone]

## 2014-07-18 NOTE — Progress Notes (Signed)
OT Cancellation Note  Patient Details Name: [name] MRN: [id number] DOB: [birth year]   Cancelled Treatment:    Reason Eval/Treat Not Completed: Other (comment).  Pt was groggy when I first checked on her and is now limited by pain.  Will check back later as schedule permits.  Donnell Beauchamp 07/18/2014, 12:16 PM [name], OTR/L [phone] 07/18/2014

## 2014-07-18 NOTE — Progress Notes (Signed)
   Subjective: 2 Days Post-Op Procedure(s) (LRB): LEFT TOTAL HIP ARTHROPLASTY (Left) Patient reports pain as mild and moderate.   Patient seen in rounds with Dr. Lequita HaltAluisio. Patient is having problems with pain in the hip, requiring pain medications and also pain and stiffness in her neck. Most likely due to positioning on the operating room table Will see how she does today with therapy to see if ready to go home.  Objective: Vital signs in last 24 hours: Temp:  [97.3 F (36.3 C)-99.6 F (37.6 C)] 99.6 F (37.6 C) (10/21 0515) Pulse Rate:  [82-98] 88 (10/21 0515) Resp:  [15-20] 20 (10/21 0515) BP: (118-169)/(53-78) 169/78 mmHg (10/21 0515) SpO2:  [100 %] 100 % (10/21 0515)  Intake/Output from previous day:  Intake/Output Summary (Last 24 hours) at 07/18/14 0718 Last data filed at 07/18/14 0600  Gross per 24 hour  Intake   1080 ml  Output   3100 ml  Net  -2020 ml     Labs:  Recent Labs  07/17/14 0427 07/18/14 0435  HGB 9.3* 9.4*    Recent Labs  07/17/14 0427 07/18/14 0435  WBC 12.4* 14.7*  RBC 3.46* 3.50*  HCT 29.4* 29.8*  PLT 363 350    Recent Labs  07/17/14 0427 07/18/14 0435  NA 142 138  K 4.4 3.9  CL 102 99  CO2 27 27  BUN 12 15  CREATININE 0.89 0.94  GLUCOSE 136* 117*  CALCIUM 9.1 9.3   No results found for this basename: LABPT, INR,  in the last 72 hours  EXAM: General - Patient is Alert and Appropriate Extremity - Neurovascular intact Sensation intact distally Incision - clean, dry, no drainage Motor Function - intact, moving foot and toes well on exam.   Assessment/Plan: 2 Days Post-Op Procedure(s) (LRB): LEFT TOTAL HIP ARTHROPLASTY (Left) Procedure(s) (LRB): LEFT TOTAL HIP ARTHROPLASTY (Left) Past Medical History  Diagnosis Date  . Anemia   . Hypertension   . Varicose veins   . H/O seasonal allergies   . Arthritis     OA LEFT HIP- SEVERE PAIN   Principal Problem:   OA (osteoarthritis) of hip  Estimated body mass index is  53.58 kg/(m^2) as calculated from the following:   Height as of this encounter: 5\' 5"  (1.651 m).   Weight as of this encounter: 146.058 kg (322 lb). Up with therapy Discharge home with home health if improves Diet - Cardiac diet Follow up - in 2 weeks Activity - WBAT left leg Disposition - Home Condition Upon Discharge - pending results with therapy D/C Meds - See DC Summary DVT Prophylaxis - Xarelto  Avel Peacerew Perkins, PA-C Orthopaedic Surgery 07/18/2014, 7:18 AM

## 2014-07-19 LAB — CBC
HEMATOCRIT: 29 % — AB (ref 36.0–46.0)
HEMOGLOBIN: 9.2 g/dL — AB (ref 12.0–15.0)
MCH: 27.1 pg (ref 26.0–34.0)
MCHC: 31.7 g/dL (ref 30.0–36.0)
MCV: 85.3 fL (ref 78.0–100.0)
Platelets: 338 10*3/uL (ref 150–400)
RBC: 3.4 MIL/uL — ABNORMAL LOW (ref 3.87–5.11)
RDW: 14.3 % (ref 11.5–15.5)
WBC: 14.3 10*3/uL — ABNORMAL HIGH (ref 4.0–10.5)

## 2014-07-19 MED ORDER — OXYCODONE HCL 5 MG PO TABS: 5.0000 mg | ORAL_TABLET | ORAL | Status: DC | PRN

## 2014-07-19 NOTE — Progress Notes (Signed)
   Subjective: 3 Days Post-Op Procedure(s) (LRB): LEFT TOTAL HIP ARTHROPLASTY (Left) Patient reports pain as mild and moderate.   Patient seen in rounds by Dr. Lequita HaltAluisio. Patient is having problems with pain in the hip, requiring pain medications Therapy notes:  Pt unable to tolerate ambulation today due to increased pain L hip.    Will see how she does with therapy. If she greatly improves and meets her goals, then possibly home later this afternoon.  If she needs one more day, then home tomorrow.  If she is not improving with therapy today, then Dr. Lequita HaltAluisio discussed with her that she will need SNF for inpatient rehab.  Objective: Vital signs in last 24 hours: Temp:  [98.5 F (36.9 C)-98.6 F (37 C)] 98.5 F (36.9 C) (10/22 0644) Pulse Rate:  [98-107] 106 (10/22 0644) Resp:  [18-20] 20 (10/22 0644) BP: (118-131)/(54-66) 118/54 mmHg (10/22 0644) SpO2:  [98 %-100 %] 98 % (10/22 0644)  Intake/Output from previous day:  Intake/Output Summary (Last 24 hours) at 07/19/14 0734 Last data filed at 07/19/14 0710  Gross per 24 hour  Intake    360 ml  Output   1600 ml  Net  -1240 ml    Intake/Output this shift: Total I/O In: -  Out: 400 [Urine:400]  Labs:  Recent Labs  07/17/14 0427 07/18/14 0435 07/19/14 0440  HGB 9.3* 9.4* 9.2*    Recent Labs  07/18/14 0435 07/19/14 0440  WBC 14.7* 14.3*  RBC 3.50* 3.40*  HCT 29.8* 29.0*  PLT 350 338    Recent Labs  07/17/14 0427 07/18/14 0435  NA 142 138  K 4.4 3.9  CL 102 99  CO2 27 27  BUN 12 15  CREATININE 0.89 0.94  GLUCOSE 136* 117*  CALCIUM 9.1 9.3   No results found for this basename: LABPT, INR,  in the last 72 hours  EXAM: General - Patient is Alert, Appropriate and Oriented Extremity - Neurovascular intact Sensation intact distally Incision - clean, dry, no drainage Motor Function - intact, moving foot and toes well on exam.   Assessment/Plan: 3 Days Post-Op Procedure(s) (LRB): LEFT TOTAL HIP  ARTHROPLASTY (Left) Procedure(s) (LRB): LEFT TOTAL HIP ARTHROPLASTY (Left) Past Medical History  Diagnosis Date  . Anemia   . Hypertension   . Varicose veins   . H/O seasonal allergies   . Arthritis     OA LEFT HIP- SEVERE PAIN   Principal Problem:   OA (osteoarthritis) of hip  Estimated body mass index is 53.58 kg/(m^2) as calculated from the following:   Height as of this encounter: 5\' 5"  (1.651 m).   Weight as of this encounter: 146.058 kg (322 lb).  Discharge home with home health versus SNF if does not improve with therapy Diet - Cardiac diet  Follow up - in 2 weeks  Activity - WBAT left leg  Disposition - Home versus SNF Condition Upon Discharge - pending results with therapy  D/C Meds - See DC Summary  DVT Prophylaxis - Xarelto  Avel Peacerew Krishana Lutze, PA-C Orthopaedic Surgery 07/19/2014, 7:34 AM   \

## 2014-07-19 NOTE — Progress Notes (Signed)
Physical Therapy Treatment Patient Details Name: [name] MRN: [id number] DOB: [birth year] Today's Date: 07/19/2014    History of Present Illness Pt is a 53 year old female s/p L THA.    PT Comments    Pt continues to be limited by pain however reports pain better today compared to yesterday.  Discussed d/c to SNF and pt seems more agreeable as she currently needs assist for mobility.    Follow Up Recommendations  SNF     Equipment Recommendations  Rolling walker with 5" wheels    Recommendations for Other Services       Precautions / Restrictions Precautions Precautions: Fall;Posterior Hip Precaution Comments: reviewed hip precautions during activity Restrictions Weight Bearing Restrictions: No Other Position/Activity Restrictions: WBAT    Mobility  Bed Mobility Overal bed mobility: Needs Assistance Bed Mobility: Supine to Sit     Supine to sit: Mod assist Sit to supine: Mod assist   General bed mobility comments: assist for L LE and L hip scooting to EOB, still required increased time however improved today compared to yesterday, bil LEs assisted onto bed  Transfers Overall transfer level: Needs assistance Equipment used: Rolling walker (2 wheeled) Transfers: Sit to/from Stand Sit to Stand: Min assist;From elevated surface         General transfer comment: verbal cues for UE and LE positioning, elevated bed to assist with standing, assist to control descent  Ambulation/Gait Ambulation/Gait assistance: Min assist;+2 safety/equipment Ambulation Distance (Feet): 20 Feet Assistive device: Rolling walker (2 wheeled) Gait Pattern/deviations: Step-to pattern;Antalgic;Trunk flexed;Decreased stance time - left     General Gait Details: pt reports pain better today however still present L hip, verbal cues for sequence, step length, RW placement, posture, increased time to perform   Stairs            Wheelchair Mobility    Modified Rankin (Stroke  Patients Only)       Balance                                    Cognition Arousal/Alertness: Awake/alert Behavior During Therapy: WFL for tasks assessed/performed Overall Cognitive Status: Within Functional Limits for tasks assessed                      Exercises      General Comments        Pertinent Vitals/Pain Pain Assessment: 0-10 Pain Score: 7  Pain Location: L hip Pain Descriptors / Indicators: Sore Pain Intervention(s): Limited activity within patient's tolerance;Monitored during session;Premedicated before session;Ice applied    Home Living                      Prior Function            PT Goals (current goals can now be found in the care plan section) Progress towards PT goals: Progressing toward goals    Frequency  7X/week    PT Plan Discharge plan needs to be updated    Co-evaluation             End of Session   Activity Tolerance: Patient limited by pain Patient left: in bed;with call bell/phone within reach;with family/visitor present     Time: 1104-1130 PT Time Calculation (min): 26 min  Charges:  $Gait Training: 23-37 mins  G Codes:      [name],[name] 07/19/2014, 12:07 PM [name], PT, DPT 07/19/2014 Pager: [phone]

## 2014-07-19 NOTE — Progress Notes (Signed)
Clinical Social Work Department BRIEF PSYCHOSOCIAL ASSESSMENT 07/19/2014  Patient:  [name], [name]     Account Number:  [id number]     [place] date:  07/16/2014  Clinical Social Worker:  Lacie Scotts  Date/Time:  07/19/2014 06:00 PM  Referred by:  Physician  Date Referred:  07/19/2014 Referred for  SNF Placement   Other Referral:   Interview type:  Patient Other interview type:    PSYCHOSOCIAL DATA Living Status:  FAMILY Admitted from facility:   Level of care:   Primary support name:  Deno Etienne Primary support relationship to patient:  CHILD, ADULT Degree of support available:   supportive    CURRENT CONCERNS  Other Concerns:    SOCIAL WORK ASSESSMENT / PLAN Pt is a 53 yr old female living at home prior to hospitalization. CSW met with pt to assist with d/c planning. This is a planned admission. Pt was hoping to d/c home with [facility] services but PT is recommending ST rehab. Pt would prefer to return home but understands rehab is needed at this time. SNF search has been initiated and bed offers provided. Pt has chosen [facility]. Authorization from [facility] is pending. CSW will continue to follow to assist with d/c planning to SNF.   Assessment/plan status:  Psychosocial Support/Ongoing Assessment of Needs Other assessment/ plan:   Information/referral to community resources:   Insurance coverage for SNF and ambulance transport reviewed.    PATIENT'S/FAMILY'S RESPONSE TO PLAN OF CARE: Pt is disappointed that she has not progressed enough to return home. She is motivated to work with PT and is willing to try [facility] Rehab.   Werner Lean LCSW [phone]

## 2014-07-19 NOTE — Progress Notes (Signed)
Physical Therapy Treatment Note   07/19/14 1600  PT Visit Information  Last PT Received On 07/19/14  Assistance Needed +2  History of Present Illness Pt is a 53 year old female s/p L THA.  PT Time Calculation  PT Start Time 1514  PT Stop Time 1554  PT Time Calculation (min) 40 min  Subjective Data  Subjective Pt performed LE exercises in supine and then ambulated again in hallway.  Pt able to progress distance a little and pace better however continues to mobilize slowly.  Pt also assist to bathroom prior to back to bed.  Precautions  Precautions Fall;Posterior Hip  Restrictions  Other Position/Activity Restrictions WBAT  Pain Assessment  Pain Assessment 0-10  Pain Score 5  Pain Location L hip  Pain Descriptors / Indicators Sore  Pain Intervention(s) Repositioned;Ice applied;Premedicated before session;Monitored during session  Cognition  Arousal/Alertness Awake/alert  Behavior During Therapy WFL for tasks assessed/performed  Overall Cognitive Status Within Functional Limits for tasks assessed  Bed Mobility  Overal bed mobility Needs Assistance  Bed Mobility Supine to Sit;Sit to Supine  Supine to sit Min assist  Sit to supine Mod assist  General bed mobility comments verbal cues for self assist and pt able to assist L LE over EOB with sheet however still required assist for scooting L hip over to EOB, assist for bil LEs upon return to bed due to fatigue  Transfers  Overall transfer level Needs assistance  Equipment used Rolling walker (2 wheeled)  Transfers Sit to/from Stand  Sit to Stand Min assist  General transfer comment verbal cues for UE and LE positioning  Ambulation/Gait  Ambulation/Gait assistance Min assist;+2 safety/equipment  Ambulation Distance (Feet) 30 Feet  Assistive device Rolling walker (2 wheeled)  Gait Pattern/deviations Step-to pattern;Antalgic;Decreased stance time - left  General Gait Details verbal cues for attempting to lift L LE instead of  scooting along floor to advance limb, pt wanted to turn toward affected LE this time so educated small steps and no hip IR for precaution  Exercises  Exercises Total Joint  Total Joint Exercises  Ankle Circles/Pumps AROM;Left;15 reps  Quad Sets AROM;Left;15 reps  Short Arc Quad AROM;Left;15 reps  Heel Slides AAROM;Left;15 reps  Hip ABduction/ADduction AAROM;Left;15 reps  PT - End of Session  Activity Tolerance Patient limited by pain  Patient left in bed;with call bell/phone within reach  PT - Assessment/Plan  PT Plan Current plan remains appropriate  PT Frequency 7X/week  Follow Up Recommendations SNF  PT equipment Rolling walker with 5" wheels  PT Goal Progression  Progress towards PT goals Progressing toward goals  PT General Charges  $$ ACUTE PT VISIT 1 Procedure  PT Treatments  $Gait Training 23-37 mins  $Therapeutic Exercise 8-22 mins   Zenovia JarredKati Marylan Glore, PT, DPT 07/19/2014 Pager: [phone]

## 2014-07-19 NOTE — Progress Notes (Signed)
Clinical Social Work Department CLINICAL SOCIAL WORK PLACEMENT NOTE 07/19/2014  Patient:  [name],[name]  Account Number:  [id number] Admit date:  07/16/2014  Clinical Social Worker:  Cori RazorJAMIE Gedeon Brandow, LCSW  Date/time:  07/19/2014 06:06 PM  Clinical Social Work is seeking post-discharge placement for this patient at the following level of care:   SKILLED NURSING   (*CSW will update this form in Epic as items are completed)   07/19/2014  [facility]/family provided with [facility] of Clinical Social Work's list of facilities offering this level of care within the geographic area requested by the patient (or if unable, by the patient's family).  07/19/2014  Patient/family informed of their freedom to choose among providers that offer the needed level of care, that participate in Medicare, Medicaid or managed care program needed by the patient, have an available bed and are willing to accept the patient.    Patient/family informed of [facility]' ownership interest in [facility], as well as of the fact that they are under no obligation to receive care at this facility.  PASARR submitted to EDS on 07/19/2014 PASARR number received on 07/19/2014  FL2 transmitted to all facilities in geographic area requested by pt/family on  07/19/2014 FL2 transmitted to all facilities within larger geographic area on   Patient informed that his/her managed care company has contracts with or will negotiate with  certain facilities, including the following:     Patient/family informed of bed offers received:  07/19/2014 [facility] Physician recommends and patient chooses bed at    Patient to be transferred to  on   Patient to be transferred to facility by  Patient and family notified of transfer on  Name of family member notified:    The following physician request were entered in Epic:   Additional Comments:  Cori RazorJamie Charity Tessier LCSW  [phone]

## 2014-07-19 NOTE — Plan of Care (Signed)
Problem: Problem: Pain Management Progression Goal: Pain controlled with prescribed medications Outcome: Progressing Pt. Stated that her pain is much better controlled today than it was yesterday and she feels she is making positive progression.

## 2014-07-20 NOTE — Progress Notes (Signed)
   Subjective: 4 Days Post-Op Procedure(s) (LRB): LEFT TOTAL HIP ARTHROPLASTY (Left) Patient reports pain as mild and moderate.   Patient seen in rounds for Dr. Lequita HaltAluisio. Patient is having problems with pain in the hip, requiring pain medications but is slowly getting better. She is not ready to go home so the Child psychotherapistocial Worker got involved to assist with placement of the patient. [facility] but waiting on insurance.  Hopefully, that will come today and she will be able to over the skilled rehab this afternoon.  If not, she will likely remain in the hospital through the weekend and will either go to SNF on Monday if approved or home if continues to make strides and becomes independent enough to go home.. She states that she does not have a lot of help at home and she will need to be more active than she is now. Patient is [facility] if insurance comes through later today.  Objective: Vital signs in last 24 hours: Temp:  [98.2 F (36.8 C)-98.8 F (37.1 C)] 98.4 F (36.9 C) (10/23 0543) Pulse Rate:  [98-104] 98 (10/23 0543) Resp:  [14-16] 16 (10/23 0543) BP: (104-126)/(60-72) 126/68 mmHg (10/23 0543) SpO2:  [98 %-100 %] 98 % (10/23 0543)  Intake/Output from previous day:  Intake/Output Summary (Last 24 hours) at 07/20/14 0843 Last data filed at 07/20/14 0543  Gross per 24 hour  Intake   1280 ml  Output    300 ml  Net    980 ml    Intake/Output this shift:    Labs:  Recent Labs  07/18/14 0435 07/19/14 0440  HGB 9.4* 9.2*    Recent Labs  07/18/14 0435 07/19/14 0440  WBC 14.7* 14.3*  RBC 3.50* 3.40*  HCT 29.8* 29.0*  PLT 350 338    Recent Labs  07/18/14 0435  NA 138  K 3.9  CL 99  CO2 27  BUN 15  CREATININE 0.94  GLUCOSE 117*  CALCIUM 9.3   No results found for this basename: LABPT, INR,  in the last 72 hours  EXAM: General - Patient is Alert, Appropriate and Oriented Extremity - Neurovascular  intact Sensation intact distally Dorsiflexion/Plantar flexion intact Incision - clean, dry, no drainage, healing Motor Function - intact, moving foot and toes well on exam.   Assessment/Plan: 4 Days Post-Op Procedure(s) (LRB): LEFT TOTAL HIP ARTHROPLASTY (Left) Procedure(s) (LRB): LEFT TOTAL HIP ARTHROPLASTY (Left) Past Medical History  Diagnosis Date  . Anemia   . Hypertension   . Varicose veins   . H/O seasonal allergies   . Arthritis     OA LEFT HIP- SEVERE PAIN   Principal Problem:   OA (osteoarthritis) of hip  Estimated body mass index is 53.58 kg/(m^2) as calculated from the following:   Height as of this encounter: 5\' 5"  (1.651 m).   Weight as of this encounter: 146.058 kg (322 lb). Up with therapy Discharge to SNF if insurance approves the transfer Diet - Cardiac diet Follow up - in 2 weeks Activity - WBAT Disposition - Skilled nursing facility Condition Upon Discharge - Pending D/C Meds - See DC Summary DVT Prophylaxis - Xarelto  Avel Peacerew Yurem Viner, PA-C Orthopaedic Surgery 07/20/2014, 8:43 AM

## 2014-07-20 NOTE — Progress Notes (Signed)
Clinical Social Work Department CLINICAL SOCIAL WORK PLACEMENT NOTE 07/20/2014  Patient:  [name],[name]  Account Number:  [id number] Admit date:  07/16/2014  Clinical Social Worker:  Cori RazorJAMIE Ezabella Teska, LCSW  Date/time:  07/19/2014 06:06 PM  Clinical Social Work is seeking post-discharge placement for this patient at the following level of care:   SKILLED NURSING   (*CSW will update this form in Epic as items are completed)   07/19/2014  Patient/family provided with [facility] of Clinical Social Work's list of facilities offering this level of care within the geographic area requested by the patient (or if unable, by the patient's family).  07/19/2014  Patient/family informed of their freedom to choose among providers that offer the needed level of care, that participate in Medicare, Medicaid or managed care program needed by the patient, have an available bed and are willing to accept the patient.    Patient/family informed of [facility]' ownership interest in [facility], as well as of the fact that they are under no obligation to receive care at this facility.  PASARR submitted to EDS on 07/19/2014 PASARR number received on 07/19/2014  FL2 transmitted to all facilities in geographic area requested by pt/family on  07/19/2014 FL2 transmitted to all facilities within larger geographic area on   Patient informed that his/her managed care company has contracts with or will negotiate with  certain facilities, including the following:     Patient/family informed of bed offers received:  07/19/2014 [facility] Physician recommends and patient chooses bed at    [facility]  07/20/2014 Patient to be transferred to facility by [facility] Patient and family notified of transfer on 07/20/2014 Name of family member notified:  Pt notified family  The following physician request were entered  in Epic:   Additional Comments: Pt agreed to d/c to SNF today though she preferred to return home. " I  don't want to go. I'm going because I need to go. " [facility] transport required. NSG reviewed d/c summary , scripts, avs. Scripts included in d/c packet.  Cori RazorJamie Georgia Delsignore LCSW [phone]

## 2014-07-20 NOTE — Progress Notes (Signed)
Physical Therapy Treatment Patient Details Name: Charlynne PanderMarie Burkhead MRN: [id number] DOB: 03-29-1961 Today's Date: 07/20/2014    History of Present Illness Pt is a 53 year old female s/p L THA.    PT Comments    Pt with great improvement today however continues to mobilize slowly and with pain.    Follow Up Recommendations  SNF     Equipment Recommendations  Rolling walker with 5" wheels    Recommendations for Other Services       Precautions / Restrictions Precautions Precautions: Fall;Posterior Hip Precaution Comments: reviewed hip precautions during activity Restrictions Other Position/Activity Restrictions: WBAT    Mobility  Bed Mobility Overal bed mobility: Needs Assistance Bed Mobility: Supine to Sit;Sit to Supine     Supine to sit: Min guard Sit to supine: Min guard   General bed mobility comments: pt able to self assist L LE using sheet today, increased time however improved since yesterday  Transfers Overall transfer level: Needs assistance Equipment used: Rolling walker (2 wheeled) Transfers: Sit to/from Stand Sit to Stand: Min guard Stand pivot transfers: Min guard       General transfer comment: verbal cues for UE and LE positioning  Ambulation/Gait Ambulation/Gait assistance: Min guard Ambulation Distance (Feet): 50 Feet Assistive device: Rolling walker (2 wheeled) Gait Pattern/deviations: Step-to pattern;Antalgic;Decreased stance time - left     General Gait Details: verbal cues for L hip/knee flexion, continues with difficulty lifing to advance L LE, improved pace however continues to remain slow   Stairs            Wheelchair Mobility    Modified Rankin (Stroke Patients Only)       Balance                                    Cognition Arousal/Alertness: Awake/alert Behavior During Therapy: WFL for tasks assessed/performed Overall Cognitive Status: Within Functional Limits for tasks assessed                       Exercises Total Joint Exercises Gluteal Sets: AROM;Both;10 reps Short Arc Quad: AROM;Left;15 reps Heel Slides: AAROM;Left;15 reps Hip ABduction/ADduction: AAROM;Left;15 reps    General Comments        Pertinent Vitals/Pain Pain Assessment: 0-10 Pain Score:  (not rated but reports better then yesterday) Pain Location: L hip Pain Descriptors / Indicators: Sore Pain Intervention(s): Limited activity within patient's tolerance;Ice applied;Monitored during session    Home Living                      Prior Function            PT Goals (current goals can now be found in the care plan section) Progress towards PT goals: Progressing toward goals    Frequency  7X/week    PT Plan Current plan remains appropriate    Co-evaluation             End of Session   Activity Tolerance: Patient limited by pain Patient left: in bed;with call bell/phone within reach     Time: 1112-1144 PT Time Calculation (min): 32 min  Charges:  $Gait Training: 23-37 mins                    G Codes:      Tinsley Lomas,KATHrine E 07/20/2014, 1:58 PM Zenovia JarredKati France Noyce, PT, DPT 07/20/2014 Pager: [phone]

## 2014-07-20 NOTE — Progress Notes (Signed)
PT Cancellation Note  Patient Details Name: Hannah PanderMarie Mcpherson MRN: [id number] DOB: Jun 25, 1961   Cancelled Treatment:    Reason Eval/Treat Not Completed: Patient declined, no reason specified Pt declines PT this afternoon.  Pt reports she has been discharged and going to SNF.  Pax Reasoner,KATHrine E 07/20/2014, 3:41 PM Zenovia JarredKati Zayna Toste, PT, DPT 07/20/2014 Pager: [phone]

## 2015-10-23 IMAGING — CR DG HIP (WITH OR WITHOUT PELVIS) 2-3V*L*
3 series · 3 of 3 positions shown · non-contrast
Comparison: 02/21/2014

CLINICAL DATA: Preop left total hip arthroplasty

EXAM:
LEFT HIP - COMPLETE 2+ VIEW

[t pelvis a.p.]
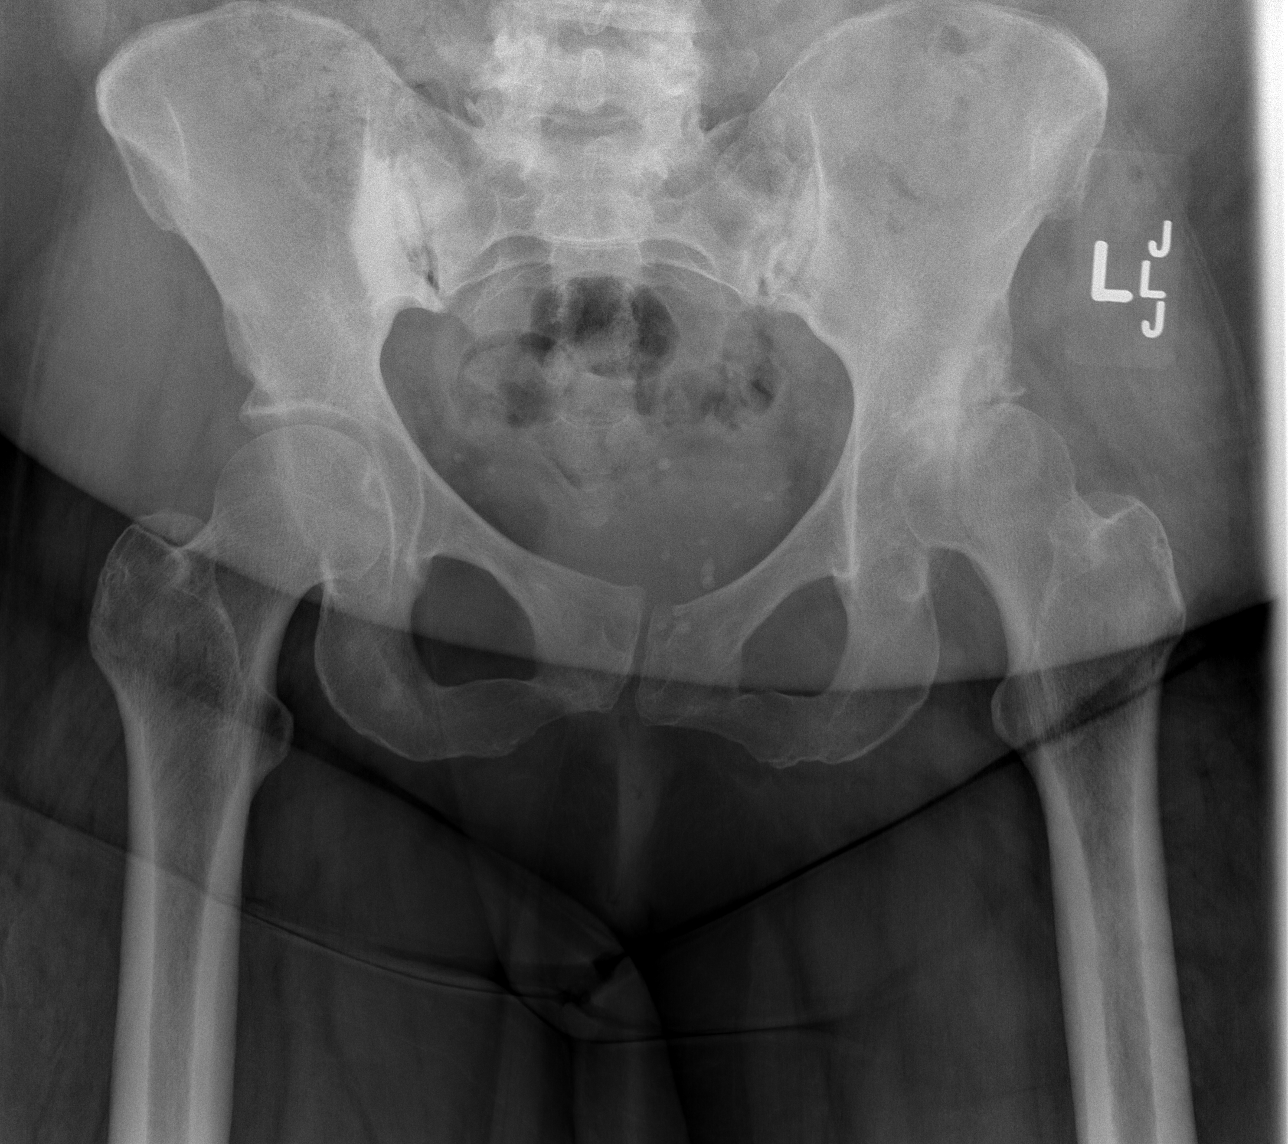

[t hip ap left]
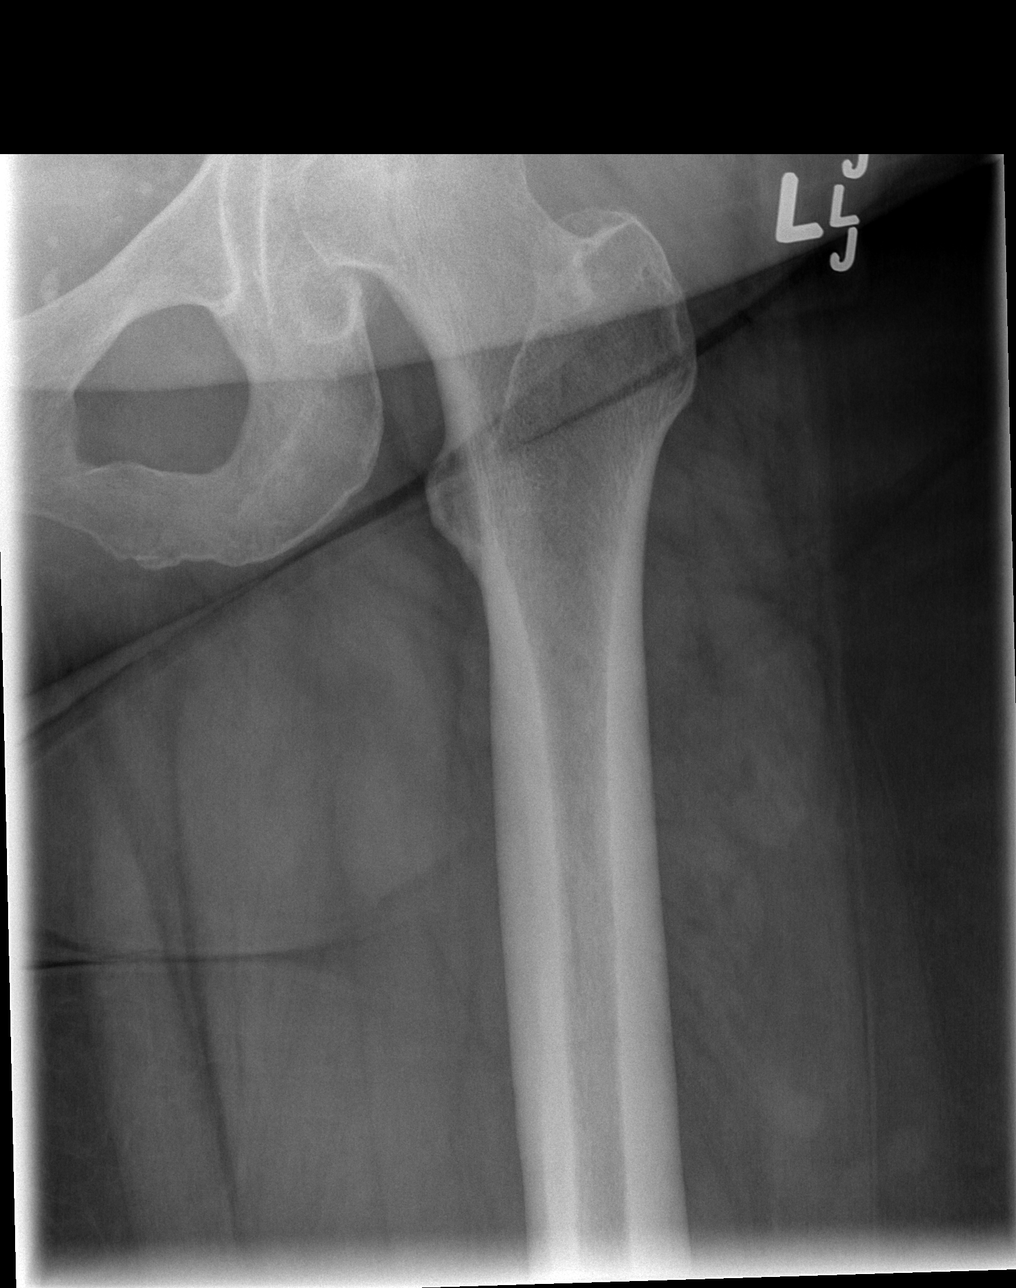

[t hip frog leg left]
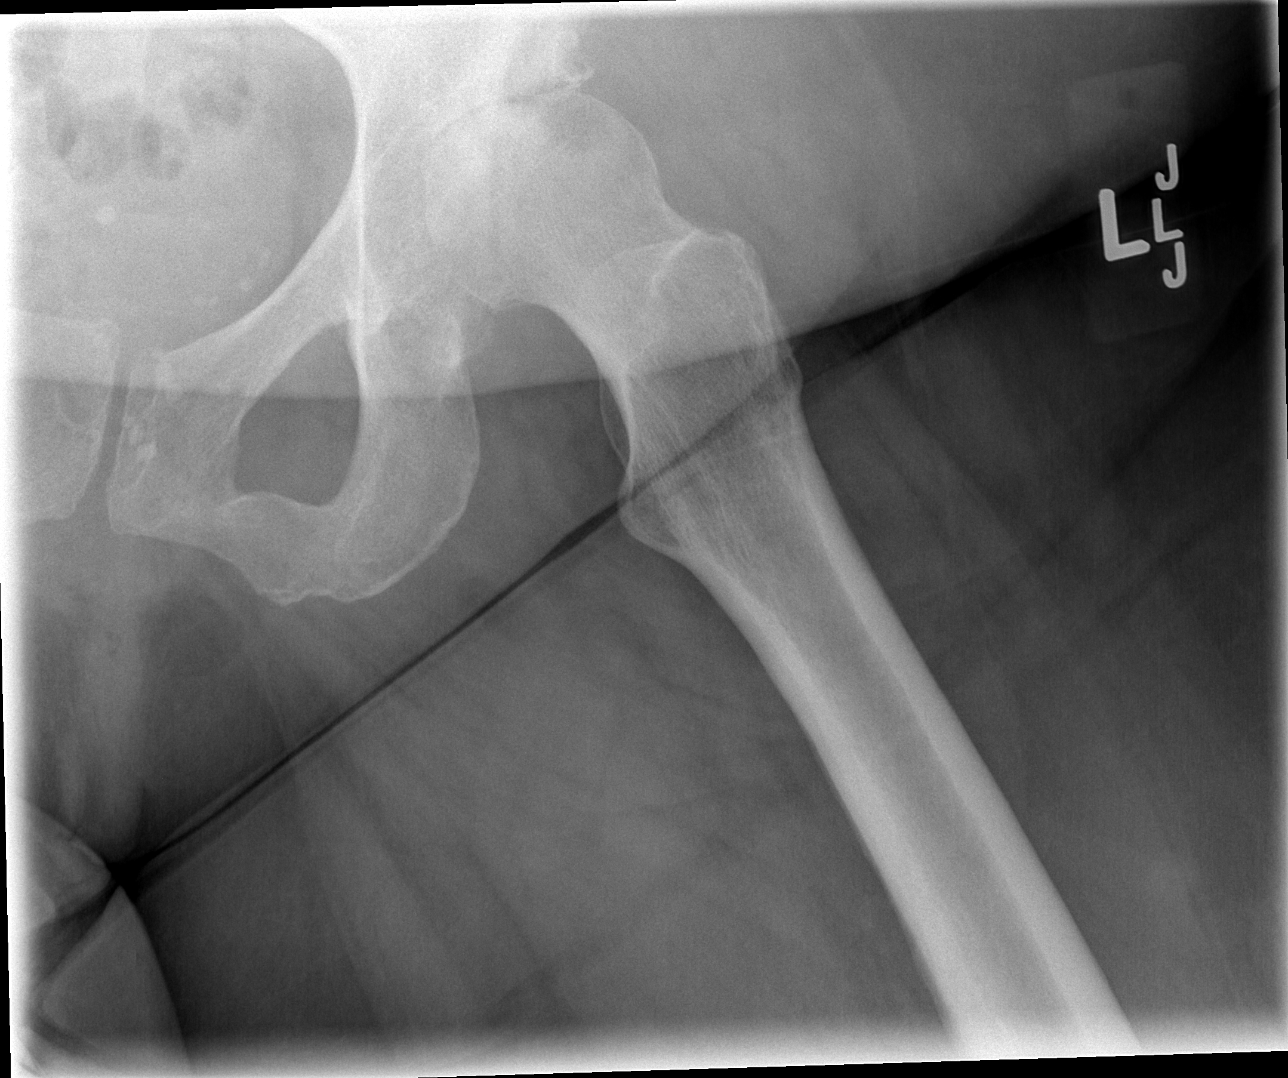

[3 of 3 positions shown; findings below may reference images not displayed]

FINDINGS: Severe left hip osteoarthritis with severe superior joint space
narrowing which has progressed compared with 02/21/2014.

There is no acute fracture or dislocation.

The right hip is unremarkable.

There is bilateral asymmetric sacroiliitis.
IMPRESSION: 1. Severe left hip osteoarthritis with severe superior joint space
narrowing.
2. Bilateral asymmetric sacroiliitis. Differential diagnosis
includes osteoarthritis, rheumatoid arthritis, psoriatic arthritis,
gout.

## 2017-04-16 ENCOUNTER — Ambulatory Visit: Payer: Self-pay | Admitting: Orthopedic Surgery

## 2017-04-16 NOTE — Progress Notes (Signed)
Please place orders in EPIC as patient is being scheduled for a pre-op appointment! Thank you! 

## 2017-04-29 NOTE — Patient Instructions (Signed)
[name]  04/29/2017   Your procedure is scheduled on: 04/30/17  [facility]  Report to admitting at      0930AM   Call this number if you have problems the morning of surgery  [phone]   Remember: ONLY 1 PERSON MAY GO WITH YOU TO SHORT STAY TO GET  READY MORNING OF YOUR SURGERY.  Do not eat food or drink liquids :After Midnight.     Take these medicines the morning of surgery with A SIP OF WATER: none                                You may not have any metal on your body including hair pins and              piercings  Do not wear jewelry, make-up, lotions, powders or perfumes, deodorant             Do not wear nail polish.  Do not shave  48 hours prior to surgery.               Do not bring valuables to the hospital. [facility] IS NOT             RESPONSIBLE   FOR VALUABLES.  Contacts, dentures or bridgework may not be worn into surgery.  Leave suitcase in the car. After surgery it may be brought to your room.                Please read over the following fact sheets you were given: _____________________________________________________________________            [facility] - Pre[facility] Before surgery, you can play an important role.  Because skin is not sterile, your skin needs to be as free of germs as possible.  You can reduce the number of germs on your skin by washing with CHG (chlorahexidine gluconate) soap before surgery.  CHG is an antiseptic cleaner which kills germs and bonds with the skin to continue killing germs even after washing. Please DO NOT use if you have an allergy to CHG or antibacterial soaps.  If your skin becomes reddened/irritated stop using the CHG and inform your nurse when you arrive at Short Stay. Do not shave (including legs and underarms) for at least 48 hours prior to the first CHG shower.  You may shave your face/neck. Please follow these instructions carefully:  1.  Shower with CHG  Soap the night before surgery and the  morning of Surgery.  2.  If you choose to wash your hair, wash your hair first as usual with your  normal  shampoo.  3.  After you shampoo, rinse your hair and body thoroughly to remove the  shampoo.                           4.  Use CHG as you would any other liquid soap.  You can apply chg directly  to the skin and wash                       Gently with a scrungie or clean washcloth.  5.  Apply the CHG Soap to your body ONLY FROM THE NECK DOWN.   Do not  use on face/ open                           Wound or open sores. Avoid contact with eyes, ears mouth and genitals (private parts).                       Wash face,  Genitals (private parts) with your normal soap.             6.  Wash thoroughly, paying special attention to the area where your surgery  will be performed.  7.  Thoroughly rinse your body with warm water from the neck down.  8.  DO NOT shower/wash with your normal soap after using and rinsing off  the CHG Soap.                9.  Pat yourself dry with a clean towel.            10.  Wear clean pajamas.            11.  Place clean sheets on your bed the night of your first shower and do not  sleep with pets. Day of Surgery : Do not apply any lotions/deodorants the morning of surgery.  Please wear clean clothes to the hospital/surgery center.  FAILURE TO FOLLOW THESE INSTRUCTIONS MAY RESULT IN THE CANCELLATION OF YOUR SURGERY PATIENT SIGNATURE_________________________________  NURSE SIGNATURE__________________________________  ________________________________________________________________________  WHAT IS A BLOOD TRANSFUSION? Blood Transfusion Information  A transfusion is the replacement of blood or some of its parts. Blood is made up of multiple cells which provide different functions.  Red blood cells carry oxygen and are used for blood loss replacement.  White blood cells fight against infection.  Platelets control  bleeding.  Plasma helps clot blood.  Other blood products are available for specialized needs, such as hemophilia or other clotting disorders. BEFORE THE TRANSFUSION  Who gives blood for transfusions?   Healthy volunteers who are fully evaluated to make sure their blood is safe. This is blood bank blood. Transfusion therapy is the safest it has ever been in the practice of medicine. Before blood is taken from a donor, a complete history is taken to make sure that person has no history of diseases nor engages in risky social behavior (examples are intravenous drug use or sexual activity with multiple partners). The donor's travel history is screened to minimize risk of transmitting infections, such as malaria. The donated blood is tested for signs of infectious diseases, such as HIV and hepatitis. The blood is then tested to be sure it is compatible with you in order to minimize the chance of a transfusion reaction. If you or a relative donates blood, this is often done in anticipation of surgery and is not appropriate for emergency situations. It takes many days to process the donated blood. RISKS AND COMPLICATIONS Although transfusion therapy is very safe and saves many lives, the main dangers of transfusion include:   Getting an infectious disease.  Developing a transfusion reaction. This is an allergic reaction to something in the blood you were given. Every precaution is taken to prevent this. The decision to have a blood transfusion has been considered carefully by your caregiver before blood is given. Blood is not given unless the benefits outweigh the risks. AFTER THE TRANSFUSION  Right after receiving a blood transfusion, you will usually feel much better and more energetic. This is especially true if  your red blood cells have gotten low (anemic). The transfusion raises the level of the red blood cells which carry oxygen, and this usually causes an energy increase.  The nurse  administering the transfusion will monitor you carefully for complications. HOME CARE INSTRUCTIONS  No special instructions are needed after a transfusion. You may find your energy is better. Speak with your caregiver about any limitations on activity for underlying diseases you may have. SEEK MEDICAL CARE IF:   Your condition is not improving after your transfusion.  You develop redness or irritation at the intravenous (IV) site. SEEK IMMEDIATE MEDICAL CARE IF:  Any of the following symptoms occur over the next 12 hours:  Shaking chills.  You have a temperature by mouth above 102 F (38.9 C), not controlled by medicine.  Chest, back, or muscle pain.  People around you feel you are not acting correctly or are confused.  Shortness of breath or difficulty breathing.  Dizziness and fainting.  You get a rash or develop hives.  You have a decrease in urine output.  Your urine turns a dark color or changes to pink, red, or brown. Any of the following symptoms occur over the next 10 days:  You have a temperature by mouth above 102 F (38.9 C), not controlled by medicine.  Shortness of breath.  Weakness after normal activity.  The white part of the eye turns yellow (jaundice).  You have a decrease in the amount of urine or are urinating less often.  Your urine turns a dark color or changes to pink, red, or brown. Document Released: 09/11/2000 Document Revised: 12/07/2011 Document Reviewed: 04/30/2008 ExitCare Patient Information 2014 [place].  _______________________________________________________________________  Incentive Spirometer  An incentive spirometer is a tool that can help keep your lungs clear and active. This tool measures how well you are filling your lungs with each breath. Taking long deep breaths may help reverse or decrease the chance of developing breathing (pulmonary) problems (especially infection) following:  A long period of time when you are  unable to move or be active. BEFORE THE PROCEDURE   If the spirometer includes an indicator to show your best effort, your nurse or respiratory therapist will set it to a desired goal.  If possible, sit up straight or lean slightly forward. Try not to slouch.  Hold the incentive spirometer in an upright position. INSTRUCTIONS FOR USE  1. Sit on the edge of your bed if possible, or sit up as far as you can in bed or on a chair. 2. Hold the incentive spirometer in an upright position. 3. Breathe out normally. 4. Place the mouthpiece in your mouth and seal your lips tightly around it. 5. Breathe in slowly and as deeply as possible, raising the piston or the ball toward the top of the column. 6. Hold your breath for 3-5 seconds or for as long as possible. Allow the piston or ball to fall to the bottom of the column. 7. Remove the mouthpiece from your mouth and breathe out normally. 8. Rest for a few seconds and repeat Steps 1 through 7 at least 10 times every 1-2 hours when you are awake. Take your time and take a few normal breaths between deep breaths. 9. The spirometer may include an indicator to show your best effort. Use the indicator as a goal to work toward during each repetition. 10. After each set of 10 deep breaths, practice coughing to be sure your lungs are clear. If you have an incision (  the cut made at the time of surgery), support your incision when coughing by placing a pillow or rolled up towels firmly against it. Once you are able to get out of bed, walk around indoors and cough well. You may stop using the incentive spirometer when instructed by your caregiver.  RISKS AND COMPLICATIONS  Take your time so you do not get dizzy or light-headed.  If you are in pain, you may need to take or ask for pain medication before doing incentive spirometry. It is harder to take a deep breath if you are having pain. AFTER USE  Rest and breathe slowly and easily.  It can be helpful to  keep track of a log of your progress. Your caregiver can provide you with a simple table to help with this. If you are using the spirometer at home, follow these instructions: [place] IF:   You are having difficultly using the spirometer.  You have trouble using the spirometer as often as instructed.  Your pain medication is not giving enough relief while using the spirometer.  You develop fever of 100.5 F (38.1 C) or higher. SEEK IMMEDIATE MEDICAL CARE IF:   You cough up bloody sputum that had not been present before.  You develop fever of 102 F (38.9 C) or greater.  You develop worsening pain at or near the incision site. MAKE SURE YOU:   Understand these instructions.  Will watch your condition.  Will get help right away if you are not doing well or get worse. Document Released: 01/25/2007 Document Revised: 12/07/2011 Document Reviewed: 03/28/2007 [facility] Patient Information 2014 [place], [place].   ________________________________________________________________________

## 2017-04-29 NOTE — Progress Notes (Signed)
Requested EKG tracing from [facility]

## 2017-04-30 ENCOUNTER — Encounter (HOSPITAL_COMMUNITY)
Admission: RE | Admit: 2017-04-30 | Discharge: 2017-04-30 | Disposition: A | Discharge status: Home / Self Care | Payer: 59 | Source: Ambulatory Visit | Attending: Orthopedic Surgery | Admitting: Orthopedic Surgery

## 2017-04-30 ENCOUNTER — Encounter (HOSPITAL_COMMUNITY): Payer: Self-pay

## 2017-04-30 DIAGNOSIS — T84031A Mechanical loosening of internal left hip prosthetic joint, initial encounter: Secondary | ICD-10-CM | POA: Diagnosis not present

## 2017-04-30 DIAGNOSIS — Z0183 Encounter for blood typing: Secondary | ICD-10-CM | POA: Insufficient documentation

## 2017-04-30 DIAGNOSIS — Z01812 Encounter for preprocedural laboratory examination: Secondary | ICD-10-CM | POA: Insufficient documentation

## 2017-04-30 DIAGNOSIS — Y838 Other surgical procedures as the cause of abnormal reaction of the patient, or of later complication, without mention of misadventure at the time of the procedure: Secondary | ICD-10-CM | POA: Insufficient documentation

## 2017-04-30 DIAGNOSIS — Z01818 Encounter for other preprocedural examination: Secondary | ICD-10-CM | POA: Insufficient documentation

## 2017-04-30 HISTORY — DX: Unspecified asthma, uncomplicated: J45.909

## 2017-04-30 LAB — COMPREHENSIVE METABOLIC PANEL
ALBUMIN: 4 g/dL (ref 3.5–5.0)
ALK PHOS: 70 U/L (ref 38–126)
ALT: 14 U/L (ref 14–54)
AST: 22 U/L (ref 15–41)
Anion gap: 7 (ref 5–15)
BUN: 16 mg/dL (ref 6–20)
CALCIUM: 9.3 mg/dL (ref 8.9–10.3)
CO2: 27 mmol/L (ref 22–32)
CREATININE: 0.77 mg/dL (ref 0.44–1.00)
Chloride: 106 mmol/L (ref 101–111)
GFR calc Af Amer: >60 mL/min (ref >60)
GFR calc non Af Amer: >60 mL/min (ref >60)
GLUCOSE: 96 mg/dL (ref 65–99)
Potassium: 4.1 mmol/L (ref 3.5–5.1)
SODIUM: 140 mmol/L (ref 135–145)
Total Bilirubin: 0.4 mg/dL (ref 0.3–1.2)
Total Protein: 8.2 g/dL — ABNORMAL HIGH (ref 6.5–8.1)

## 2017-04-30 LAB — SURGICAL PCR SCREEN
MRSA, PCR: NEGATIVE
Staphylococcus aureus: NEGATIVE

## 2017-04-30 LAB — PROTIME-INR
INR: 1.09
Prothrombin Time: 14.2 seconds (ref 11.4–15.2)

## 2017-04-30 LAB — CBC
HCT: 33.6 % — ABNORMAL LOW (ref 36.0–46.0)
HEMOGLOBIN: 10.7 g/dL — AB (ref 12.0–15.0)
MCH: 27 pg (ref 26.0–34.0)
MCHC: 31.8 g/dL (ref 30.0–36.0)
MCV: 84.8 fL (ref 78.0–100.0)
Platelets: 347 10*3/uL (ref 150–400)
RBC: 3.96 MIL/uL (ref 3.87–5.11)
RDW: 14.3 % (ref 11.5–15.5)
WBC: 9.9 10*3/uL (ref 4.0–10.5)

## 2017-04-30 LAB — APTT: APTT: 29 s (ref 24–36)

## 2017-04-30 NOTE — Progress Notes (Signed)
ekg 03/10/17 chart Stress 03/23/17/ chart

## 2017-05-02 ENCOUNTER — Ambulatory Visit: Payer: Self-pay | Admitting: Orthopedic Surgery
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
## 2017-05-04 MED ORDER — DEXTROSE 5 % IV SOLN
3.0000 g | INTRAVENOUS | Status: AC
  Administered 2017-05-05: 3 g via INTRAVENOUS
  Filled 2017-05-04: qty 3

## 2017-05-05 ENCOUNTER — Inpatient Hospital Stay (HOSPITAL_COMMUNITY): Payer: 59 | Admitting: Anesthesiology

## 2017-05-05 ENCOUNTER — Inpatient Hospital Stay (HOSPITAL_COMMUNITY)
Admission: RE | Admit: 2017-05-05 | Discharge: 2017-05-05 | Disposition: A | Discharge status: Home / Self Care | Payer: 59 | Source: Ambulatory Visit | Attending: Orthopedic Surgery | Admitting: Orthopedic Surgery

## 2017-05-05 ENCOUNTER — Encounter (HOSPITAL_COMMUNITY): Payer: Self-pay | Admitting: *Deleted

## 2017-05-05 ENCOUNTER — Encounter (HOSPITAL_COMMUNITY)
Admission: RE | Disposition: A | Discharge status: Home / Self Care | Payer: Self-pay | Source: Ambulatory Visit | Attending: Orthopedic Surgery

## 2017-05-05 ENCOUNTER — Inpatient Hospital Stay (HOSPITAL_COMMUNITY)
Admission: RE | Admit: 2017-05-05 | Discharge: 2017-05-07 | DRG: 468 | Disposition: A | Discharge status: Home / Self Care | Payer: 59 | Source: Ambulatory Visit | Attending: Orthopedic Surgery | Admitting: Orthopedic Surgery

## 2017-05-05 DIAGNOSIS — Z823 Family history of stroke: Secondary | ICD-10-CM

## 2017-05-05 DIAGNOSIS — Z8262 Family history of osteoporosis: Secondary | ICD-10-CM

## 2017-05-05 DIAGNOSIS — Z96649 Presence of unspecified artificial hip joint: Secondary | ICD-10-CM

## 2017-05-05 DIAGNOSIS — Z809 Family history of malignant neoplasm, unspecified: Secondary | ICD-10-CM

## 2017-05-05 DIAGNOSIS — Z833 Family history of diabetes mellitus: Secondary | ICD-10-CM | POA: Diagnosis not present

## 2017-05-05 DIAGNOSIS — M25562 Pain in left knee: Secondary | ICD-10-CM | POA: Diagnosis present

## 2017-05-05 DIAGNOSIS — I10 Essential (primary) hypertension: Secondary | ICD-10-CM | POA: Diagnosis present

## 2017-05-05 DIAGNOSIS — T84031A Mechanical loosening of internal left hip prosthetic joint, initial encounter: Secondary | ICD-10-CM | POA: Diagnosis present

## 2017-05-05 DIAGNOSIS — Z419 Encounter for procedure for purposes other than remedying health state, unspecified: Secondary | ICD-10-CM

## 2017-05-05 DIAGNOSIS — Z9049 Acquired absence of other specified parts of digestive tract: Secondary | ICD-10-CM | POA: Diagnosis not present

## 2017-05-05 DIAGNOSIS — T84018A Broken internal joint prosthesis, other site, initial encounter: Secondary | ICD-10-CM

## 2017-05-05 DIAGNOSIS — M1612 Unilateral primary osteoarthritis, left hip: Secondary | ICD-10-CM | POA: Diagnosis present

## 2017-05-05 DIAGNOSIS — Z811 Family history of alcohol abuse and dependence: Secondary | ICD-10-CM | POA: Diagnosis not present

## 2017-05-05 DIAGNOSIS — Z9889 Other specified postprocedural states: Secondary | ICD-10-CM | POA: Diagnosis not present

## 2017-05-05 DIAGNOSIS — Y792 Prosthetic and other implants, materials and accessory orthopedic devices associated with adverse incidents: Secondary | ICD-10-CM | POA: Diagnosis present

## 2017-05-05 DIAGNOSIS — G8929 Other chronic pain: Secondary | ICD-10-CM | POA: Diagnosis present

## 2017-05-05 DIAGNOSIS — Z8249 Family history of ischemic heart disease and other diseases of the circulatory system: Secondary | ICD-10-CM

## 2017-05-05 DIAGNOSIS — Z8261 Family history of arthritis: Secondary | ICD-10-CM

## 2017-05-05 HISTORY — PX: ANTERIOR HIP REVISION: SHX6527

## 2017-05-05 LAB — POCT I-STAT 4, (NA,K, GLUC, HGB,HCT)
Glucose, Bld: 124 mg/dL — ABNORMAL HIGH (ref 65–99)
HEMATOCRIT: 29 % — AB (ref 36.0–46.0)
HEMOGLOBIN: 9.9 g/dL — AB (ref 12.0–15.0)
Potassium: 3.4 mmol/L — ABNORMAL LOW (ref 3.5–5.1)
Sodium: 138 mmol/L (ref 135–145)

## 2017-05-05 SURGERY — REVISION, TOTAL ARTHROPLASTY, HIP, ANTERIOR APPROACH
Anesthesia: General | Site: Hip | Laterality: Left

## 2017-05-05 MED ORDER — DOCUSATE SODIUM 100 MG PO CAPS
100.0000 mg | ORAL_CAPSULE | Freq: Two times a day (BID) | ORAL | Status: DC
  Administered 2017-05-05 – 2017-05-07 (×4): 100 mg via ORAL
  Filled 2017-05-05 (×4): qty 1

## 2017-05-05 MED ORDER — MORPHINE SULFATE (PF) 4 MG/ML IV SOLN
INTRAVENOUS | Status: AC
  Filled 2017-05-05: qty 1

## 2017-05-05 MED ORDER — MIDAZOLAM HCL 5 MG/5ML IJ SOLN
INTRAMUSCULAR | Status: DC | PRN
  Administered 2017-05-05: 2 mg via INTRAVENOUS

## 2017-05-05 MED ORDER — TRAMADOL HCL 50 MG PO TABS
50.0000 mg | ORAL_TABLET | Freq: Four times a day (QID) | ORAL | Status: DC | PRN
  Administered 2017-05-06: 100 mg via ORAL
  Filled 2017-05-05: qty 2

## 2017-05-05 MED ORDER — SODIUM CHLORIDE 0.9 % IV SOLN
INTRAVENOUS | Status: DC | PRN
  Administered 2017-05-05: 14:00:00 via INTRAVENOUS

## 2017-05-05 MED ORDER — HYDRALAZINE HCL 20 MG/ML IJ SOLN
INTRAMUSCULAR | Status: DC | PRN
  Administered 2017-05-05: 4 mg via INTRAVENOUS

## 2017-05-05 MED ORDER — DEXAMETHASONE SODIUM PHOSPHATE 10 MG/ML IJ SOLN
10.0000 mg | Freq: Once | INTRAMUSCULAR | Status: AC
  Administered 2017-05-05: 10 mg via INTRAVENOUS

## 2017-05-05 MED ORDER — ONDANSETRON HCL 4 MG/2ML IJ SOLN: 4.0000 mg | Freq: Four times a day (QID) | INTRAMUSCULAR | Status: DC | PRN

## 2017-05-05 MED ORDER — OXYCODONE HCL 5 MG PO TABS
5.0000 mg | ORAL_TABLET | ORAL | Status: DC | PRN
  Administered 2017-05-05: 10 mg via ORAL
  Filled 2017-05-05: qty 2

## 2017-05-05 MED ORDER — DIPHENHYDRAMINE HCL 50 MG/ML IJ SOLN
INTRAMUSCULAR | Status: AC
  Filled 2017-05-05: qty 1

## 2017-05-05 MED ORDER — LABETALOL HCL 5 MG/ML IV SOLN
INTRAVENOUS | Status: AC
  Filled 2017-05-05: qty 4

## 2017-05-05 MED ORDER — HYDROMORPHONE HCL-NACL 0.5-0.9 MG/ML-% IV SOSY: 0.2500 mg | PREFILLED_SYRINGE | INTRAVENOUS | Status: DC | PRN

## 2017-05-05 MED ORDER — METHOCARBAMOL 1000 MG/10ML IJ SOLN
500.0000 mg | Freq: Four times a day (QID) | INTRAVENOUS | Status: DC | PRN
  Administered 2017-05-05: 500 mg via INTRAVENOUS
  Filled 2017-05-05: qty 550
  Filled 2017-05-05: qty 5

## 2017-05-05 MED ORDER — VANCOMYCIN HCL IN DEXTROSE 1-5 GM/200ML-% IV SOLN
1000.0000 mg | Freq: Two times a day (BID) | INTRAVENOUS | Status: AC
  Administered 2017-05-06: 1000 mg via INTRAVENOUS
  Filled 2017-05-05: qty 200

## 2017-05-05 MED ORDER — LIDOCAINE 2% (20 MG/ML) 5 ML SYRINGE
INTRAMUSCULAR | Status: AC
  Filled 2017-05-05: qty 5

## 2017-05-05 MED ORDER — ACETAMINOPHEN 10 MG/ML IV SOLN
1000.0000 mg | Freq: Once | INTRAVENOUS | Status: AC
  Administered 2017-05-05: 1000 mg via INTRAVENOUS

## 2017-05-05 MED ORDER — POLYETHYLENE GLYCOL 3350 17 G PO PACK: 17.0000 g | PACK | Freq: Every day | ORAL | Status: DC | PRN

## 2017-05-05 MED ORDER — BUPIVACAINE HCL (PF) 0.25 % IJ SOLN
INTRAMUSCULAR | Status: AC
  Filled 2017-05-05: qty 30

## 2017-05-05 MED ORDER — AZELASTINE HCL 0.1 % NA SOLN: 1.0000 | Freq: Every evening | NASAL | Status: DC | PRN

## 2017-05-05 MED ORDER — DIPHENHYDRAMINE HCL 12.5 MG/5ML PO ELIX: 12.5000 mg | ORAL_SOLUTION | ORAL | Status: DC | PRN

## 2017-05-05 MED ORDER — SCOPOLAMINE 1 MG/3DAYS TD PT72
MEDICATED_PATCH | TRANSDERMAL | Status: AC
  Filled 2017-05-05: qty 1

## 2017-05-05 MED ORDER — TRANEXAMIC ACID 1000 MG/10ML IV SOLN
1000.0000 mg | INTRAVENOUS | Status: AC
  Administered 2017-05-05: 1000 mg via INTRAVENOUS
  Filled 2017-05-05: qty 1100

## 2017-05-05 MED ORDER — LACTATED RINGERS IV SOLN
INTRAVENOUS | Status: DC
  Administered 2017-05-05 (×2): via INTRAVENOUS

## 2017-05-05 MED ORDER — FLEET ENEMA 7-19 GM/118ML RE ENEM: 1.0000 | ENEMA | Freq: Once | RECTAL | Status: DC | PRN

## 2017-05-05 MED ORDER — FLUTICASONE PROPIONATE 50 MCG/ACT NA SUSP
1.0000 | Freq: Every day | NASAL | Status: DC
  Administered 2017-05-05 – 2017-05-06 (×2): 1 via NASAL
  Filled 2017-05-05: qty 16

## 2017-05-05 MED ORDER — MENTHOL 3 MG MT LOZG
1.0000 | LOZENGE | OROMUCOSAL | Status: DC | PRN
  Administered 2017-05-06: 3 mg via ORAL
  Filled 2017-05-05: qty 9

## 2017-05-05 MED ORDER — HYDROMORPHONE HCL-NACL 0.5-0.9 MG/ML-% IV SOSY
PREFILLED_SYRINGE | INTRAVENOUS | Status: AC
  Filled 2017-05-05: qty 4

## 2017-05-05 MED ORDER — LIDOCAINE HCL (CARDIAC) 20 MG/ML IV SOLN
INTRAVENOUS | Status: DC | PRN
  Administered 2017-05-05: 100 mg via INTRAVENOUS

## 2017-05-05 MED ORDER — ACETAMINOPHEN 650 MG RE SUPP: 650.0000 mg | Freq: Four times a day (QID) | RECTAL | Status: DC | PRN

## 2017-05-05 MED ORDER — FENTANYL CITRATE (PF) 100 MCG/2ML IJ SOLN
INTRAMUSCULAR | Status: DC | PRN
  Administered 2017-05-05: 25 ug via INTRAVENOUS
  Administered 2017-05-05: 100 ug via INTRAVENOUS
  Administered 2017-05-05: 25 ug via INTRAVENOUS
  Administered 2017-05-05: 50 ug via INTRAVENOUS
  Administered 2017-05-05: 100 ug via INTRAVENOUS
  Administered 2017-05-05 (×2): 25 ug via INTRAVENOUS

## 2017-05-05 MED ORDER — METOCLOPRAMIDE HCL 5 MG PO TABS: 5.0000 mg | ORAL_TABLET | Freq: Three times a day (TID) | ORAL | Status: DC | PRN

## 2017-05-05 MED ORDER — STERILE WATER FOR IRRIGATION IR SOLN
Status: DC | PRN
  Administered 2017-05-05: 2000 mL

## 2017-05-05 MED ORDER — SCOPOLAMINE 1 MG/3DAYS TD PT72
MEDICATED_PATCH | TRANSDERMAL | Status: DC | PRN
  Administered 2017-05-05: 1 via TRANSDERMAL

## 2017-05-05 MED ORDER — FENTANYL CITRATE (PF) 250 MCG/5ML IJ SOLN
INTRAMUSCULAR | Status: AC
  Filled 2017-05-05: qty 5

## 2017-05-05 MED ORDER — ACETAMINOPHEN 10 MG/ML IV SOLN
INTRAVENOUS | Status: AC
  Filled 2017-05-05: qty 100

## 2017-05-05 MED ORDER — FENTANYL CITRATE (PF) 100 MCG/2ML IJ SOLN
INTRAMUSCULAR | Status: AC
  Filled 2017-05-05: qty 2

## 2017-05-05 MED ORDER — ONDANSETRON HCL 4 MG/2ML IJ SOLN
INTRAMUSCULAR | Status: AC
  Filled 2017-05-05: qty 2

## 2017-05-05 MED ORDER — ROCURONIUM BROMIDE 50 MG/5ML IV SOSY
PREFILLED_SYRINGE | INTRAVENOUS | Status: AC
  Filled 2017-05-05: qty 5

## 2017-05-05 MED ORDER — TRANEXAMIC ACID 1000 MG/10ML IV SOLN
1000.0000 mg | Freq: Once | INTRAVENOUS | Status: AC
  Administered 2017-05-05: 1000 mg via INTRAVENOUS
  Filled 2017-05-05: qty 1100

## 2017-05-05 MED ORDER — RIVAROXABAN 10 MG PO TABS
10.0000 mg | ORAL_TABLET | Freq: Every day | ORAL | Status: DC
  Administered 2017-05-06 – 2017-05-07 (×2): 10 mg via ORAL
  Filled 2017-05-05 (×2): qty 1

## 2017-05-05 MED ORDER — DIPHENHYDRAMINE HCL 50 MG/ML IJ SOLN
INTRAMUSCULAR | Status: DC | PRN
  Administered 2017-05-05: 12.5 mg via INTRAVENOUS

## 2017-05-05 MED ORDER — METOCLOPRAMIDE HCL 5 MG/ML IJ SOLN: 5.0000 mg | Freq: Three times a day (TID) | INTRAMUSCULAR | Status: DC | PRN

## 2017-05-05 MED ORDER — HYDRALAZINE HCL 20 MG/ML IJ SOLN
INTRAMUSCULAR | Status: AC
  Filled 2017-05-05: qty 1

## 2017-05-05 MED ORDER — ROCURONIUM BROMIDE 100 MG/10ML IV SOLN
INTRAVENOUS | Status: DC | PRN
  Administered 2017-05-05: 10 mg via INTRAVENOUS
  Administered 2017-05-05: 50 mg via INTRAVENOUS
  Administered 2017-05-05: 10 mg via INTRAVENOUS
  Administered 2017-05-05: 20 mg via INTRAVENOUS

## 2017-05-05 MED ORDER — SODIUM CHLORIDE 0.9 % IJ SOLN
INTRAMUSCULAR | Status: AC
  Filled 2017-05-05: qty 10

## 2017-05-05 MED ORDER — HYDROMORPHONE HCL-NACL 0.5-0.9 MG/ML-% IV SOSY
PREFILLED_SYRINGE | INTRAVENOUS | Status: AC
  Filled 2017-05-05: qty 2

## 2017-05-05 MED ORDER — DEXAMETHASONE SODIUM PHOSPHATE 10 MG/ML IJ SOLN
INTRAMUSCULAR | Status: AC
  Filled 2017-05-05: qty 1

## 2017-05-05 MED ORDER — ONDANSETRON HCL 4 MG PO TABS: 4.0000 mg | ORAL_TABLET | Freq: Four times a day (QID) | ORAL | Status: DC | PRN

## 2017-05-05 MED ORDER — ACETAMINOPHEN 500 MG PO TABS
1000.0000 mg | ORAL_TABLET | Freq: Four times a day (QID) | ORAL | Status: AC
  Administered 2017-05-05 – 2017-05-06 (×3): 1000 mg via ORAL
  Filled 2017-05-05 (×3): qty 2

## 2017-05-05 MED ORDER — MIDAZOLAM HCL 2 MG/2ML IJ SOLN
INTRAMUSCULAR | Status: AC
  Filled 2017-05-05: qty 2

## 2017-05-05 MED ORDER — LORATADINE 10 MG PO TABS
10.0000 mg | ORAL_TABLET | Freq: Every day | ORAL | Status: DC
  Administered 2017-05-05 – 2017-05-06 (×2): 10 mg via ORAL
  Filled 2017-05-05 (×2): qty 1

## 2017-05-05 MED ORDER — DEXAMETHASONE SODIUM PHOSPHATE 10 MG/ML IJ SOLN
10.0000 mg | Freq: Once | INTRAMUSCULAR | Status: AC
  Administered 2017-05-06: 11:00:00 10 mg via INTRAVENOUS
  Filled 2017-05-05: qty 1

## 2017-05-05 MED ORDER — MIDAZOLAM HCL 2 MG/2ML IJ SOLN
INTRAMUSCULAR | Status: AC
Start: 2017-05-05 — End: 2017-05-05
  Filled 2017-05-05: qty 2

## 2017-05-05 MED ORDER — CHLORHEXIDINE GLUCONATE 4 % EX LIQD: 60.0000 mL | Freq: Once | CUTANEOUS | Status: DC

## 2017-05-05 MED ORDER — OXYCODONE HCL 5 MG PO TABS
5.0000 mg | ORAL_TABLET | ORAL | Status: DC | PRN
  Administered 2017-05-05 – 2017-05-07 (×11): 10 mg via ORAL
  Filled 2017-05-05 (×11): qty 2

## 2017-05-05 MED ORDER — METHOCARBAMOL 500 MG PO TABS
500.0000 mg | ORAL_TABLET | Freq: Four times a day (QID) | ORAL | Status: DC | PRN
  Administered 2017-05-06 – 2017-05-07 (×6): 500 mg via ORAL
  Filled 2017-05-05 (×6): qty 1

## 2017-05-05 MED ORDER — LABETALOL HCL 5 MG/ML IV SOLN
INTRAVENOUS | Status: DC | PRN
  Administered 2017-05-05 (×2): 5 mg via INTRAVENOUS

## 2017-05-05 MED ORDER — 0.9 % SODIUM CHLORIDE (POUR BTL) OPTIME
TOPICAL | Status: DC | PRN
  Administered 2017-05-05: 1000 mL

## 2017-05-05 MED ORDER — PROPOFOL 10 MG/ML IV BOLUS
INTRAVENOUS | Status: AC
  Filled 2017-05-05: qty 20

## 2017-05-05 MED ORDER — SODIUM CHLORIDE 0.9 % IV SOLN
INTRAVENOUS | Status: DC
  Administered 2017-05-05: 20:00:00 via INTRAVENOUS

## 2017-05-05 MED ORDER — MORPHINE SULFATE (PF) 4 MG/ML IV SOLN
1.0000 mg | INTRAVENOUS | Status: DC | PRN
  Administered 2017-05-05 – 2017-05-06 (×2): 1 mg via INTRAVENOUS
  Filled 2017-05-05: qty 1

## 2017-05-05 MED ORDER — SUGAMMADEX SODIUM 500 MG/5ML IV SOLN
INTRAVENOUS | Status: AC
  Filled 2017-05-05: qty 5

## 2017-05-05 MED ORDER — ONDANSETRON HCL 4 MG/2ML IJ SOLN
INTRAMUSCULAR | Status: DC | PRN
  Administered 2017-05-05: 4 mg via INTRAVENOUS

## 2017-05-05 MED ORDER — HYDROCHLOROTHIAZIDE 12.5 MG PO CAPS
12.5000 mg | ORAL_CAPSULE | Freq: Every day | ORAL | Status: DC
  Filled 2017-05-05: qty 1

## 2017-05-05 MED ORDER — ACETAMINOPHEN 325 MG PO TABS: 650.0000 mg | ORAL_TABLET | Freq: Four times a day (QID) | ORAL | Status: DC | PRN

## 2017-05-05 MED ORDER — PHENOL 1.4 % MT LIQD: 1.0000 | OROMUCOSAL | Status: DC | PRN

## 2017-05-05 MED ORDER — PROPOFOL 10 MG/ML IV BOLUS
INTRAVENOUS | Status: DC | PRN
  Administered 2017-05-05: 200 mg via INTRAVENOUS

## 2017-05-05 MED ORDER — BISACODYL 10 MG RE SUPP: 10.0000 mg | Freq: Every day | RECTAL | Status: DC | PRN

## 2017-05-05 SURGICAL SUPPLY — 65 items
BAG DECANTER FOR FLEXI CONT (MISCELLANEOUS) ×3 IMPLANT
BAG ZIPLOCK 12X15 (MISCELLANEOUS) ×9 IMPLANT
BIT DRILL 2.8X128 (BIT) ×2 IMPLANT
BIT DRILL 2.8X128MM (BIT) ×1
BLADE EXTENDED COATED 6.5IN (ELECTRODE) ×3 IMPLANT
BLADE SAW SAG 73X25 THK (BLADE) ×2
BLADE SAW SGTL 73X25 THK (BLADE) ×1 IMPLANT
CHLORAPREP W/TINT 26ML (MISCELLANEOUS) ×6 IMPLANT
CLOSURE WOUND 1/2 X4 (GAUZE/BANDAGES/DRESSINGS) ×1
COVER SURGICAL LIGHT HANDLE (MISCELLANEOUS) ×3 IMPLANT
DRAPE INCISE 23X17 IOBAN STRL (DRAPES) ×6
DRAPE INCISE IOBAN 23X17 STRL (DRAPES) ×3 IMPLANT
DRAPE ORTHO SPLIT 77X108 STRL (DRAPES) ×4
DRAPE POUCH INSTRU U-SHP 10X18 (DRAPES) ×3 IMPLANT
DRAPE SURG ORHT 6 SPLT 77X108 (DRAPES) ×2 IMPLANT
DRAPE U-SHAPE 47X51 STRL (DRAPES) ×3 IMPLANT
DRSG EMULSION OIL 3X16 NADH (GAUZE/BANDAGES/DRESSINGS) ×3 IMPLANT
DRSG MEPILEX BORDER 4X12 (GAUZE/BANDAGES/DRESSINGS) ×3 IMPLANT
DRSG MEPILEX BORDER 4X4 (GAUZE/BANDAGES/DRESSINGS) ×6 IMPLANT
ELECT REM PT RETURN 15FT ADLT (MISCELLANEOUS) ×3 IMPLANT
EVACUATOR 1/8 PVC DRAIN (DRAIN) ×3 IMPLANT
FACESHIELD WRAPAROUND (MASK) ×12 IMPLANT
GAUZE SPONGE 4X4 12PLY STRL (GAUZE/BANDAGES/DRESSINGS) ×3 IMPLANT
GLOVE BIO SURGEON STRL SZ7.5 (GLOVE) ×3 IMPLANT
GLOVE BIO SURGEON STRL SZ8 (GLOVE) ×3 IMPLANT
GLOVE BIOGEL PI IND STRL 7.5 (GLOVE) ×4 IMPLANT
GLOVE BIOGEL PI IND STRL 8 (GLOVE) ×3 IMPLANT
GLOVE BIOGEL PI INDICATOR 7.5 (GLOVE) ×8
GLOVE BIOGEL PI INDICATOR 8 (GLOVE) ×6
GLOVE SURG SS PI 6.5 STRL IVOR (GLOVE) ×6 IMPLANT
GLOVE SURG SS PI 7.5 STRL IVOR (GLOVE) ×6 IMPLANT
GOWN STRL REUS W/ TWL XL LVL3 (GOWN DISPOSABLE) ×1 IMPLANT
GOWN STRL REUS W/TWL LRG LVL3 (GOWN DISPOSABLE) ×3 IMPLANT
GOWN STRL REUS W/TWL XL LVL3 (GOWN DISPOSABLE) ×11 IMPLANT
GUIDEWIRE BALL NOSE 100CM (WIRE) ×3 IMPLANT
HEAD BIO DELA CER SROM 32PLUS3 ×1 IMPLANT
IMMOBILIZER KNEE 20 (SOFTGOODS) ×3
IMMOBILIZER KNEE 20 THIGH 36 (SOFTGOODS) ×1 IMPLANT
MANIFOLD NEPTUNE II (INSTRUMENTS) ×3 IMPLANT
MARKER SKIN DUAL TIP RULER LAB (MISCELLANEOUS) ×3 IMPLANT
NDL SAFETY ECLIPSE 18X1.5 (NEEDLE) ×1 IMPLANT
NEEDLE HYPO 18GX1.5 SHARP (NEEDLE) ×2
PADDING CAST COTTON 6X4 STRL (CAST SUPPLIES) ×3 IMPLANT
PASSER SUT SWANSON 36MM LOOP (INSTRUMENTS) ×3 IMPLANT
POSITIONER SURGICAL ARM (MISCELLANEOUS) ×3 IMPLANT
SLEEVE FEM PROX 18D LRG (Hips) ×3 IMPLANT
SPONGE LAP 18X18 X RAY DECT (DISPOSABLE) ×3 IMPLANT
SROM BIO DELA CER HEAD 32PLUS3 ×3 IMPLANT
SROM STM LNG 36P8L 18X13X215L ×3 IMPLANT
STAPLER VISISTAT 35W (STAPLE) ×3 IMPLANT
STEM LNG SROM 36P8L 18X13X215L ×1 IMPLANT
STRIP CLOSURE SKIN 1/2X4 (GAUZE/BANDAGES/DRESSINGS) ×2 IMPLANT
SUCTION FRAZIER 12FR DISP (SUCTIONS) ×3 IMPLANT
SUCTION FRAZIER HANDLE 10FR (MISCELLANEOUS) ×2
SUCTION TUBE FRAZIER 10FR DISP (MISCELLANEOUS) ×1 IMPLANT
SUT ETHIBOND NAB CT1 #1 30IN (SUTURE) ×6 IMPLANT
SUT VIC AB 1 CT1 27 (SUTURE) ×6
SUT VIC AB 1 CT1 27XBRD ANTBC (SUTURE) ×3 IMPLANT
SUT VIC AB 2-0 CT1 27 (SUTURE) ×6
SUT VIC AB 2-0 CT1 TAPERPNT 27 (SUTURE) ×3 IMPLANT
SUT VLOC 180 0 24IN GS25 (SUTURE) ×6 IMPLANT
SYR 50ML LL SCALE MARK (SYRINGE) ×3 IMPLANT
TOWEL OR 17X26 10 PK STRL BLUE (TOWEL DISPOSABLE) ×6 IMPLANT
TRAY FOLEY CATH SILVER 14FR (SET/KITS/TRAYS/PACK) ×3 IMPLANT
YANKAUER SUCT BULB TIP 10FT TU (MISCELLANEOUS) ×3 IMPLANT
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
## 2017-05-05 NOTE — Interval H&P Note (Signed)
History and Physical Interval Note:  05/05/2017 11:54 AM  [name]  has presented today for surgery, with the diagnosis of Loose Left Total hip arthroplasty   The various methods of treatment have been discussed with the patient and family. After consideration of risks, benefits and other options for treatment, the patient has consented to  Procedure(s): Left hip femoral versus total hip arthroplasty revision (Left) as a surgical intervention .  The patient's history has been reviewed, patient examined, no change in status, stable for surgery.  I have reviewed the patient's chart and labs.  Questions were answered to the patient's satisfaction.     Loanne DrillingALUISIO,Waverley Krempasky V

## 2017-05-05 NOTE — Discharge Instructions (Addendum)
Dr. Gaynelle Arabian Total Joint Specialist [facility] [address] [place], [place] [address] [phone]   POSTERIOR TOTAL HIP REPLACEMENT POSTOPERATIVE DIRECTIONS  Hip Rehabilitation, Guidelines Following Surgery  The results of a hip operation are greatly improved after range of motion and muscle strengthening exercises. Follow all safety measures which are given to protect your hip. If any of these exercises cause increased pain or swelling in your joint, decrease the amount until you are comfortable again. Then slowly increase the exercises. Call your caregiver if you have problems or questions.   HOME CARE INSTRUCTIONS  Remove items at home which could result in a fall. This includes throw rugs or furniture in walking pathways.   ICE to the affected hip every three hours for 30 minutes at a time and then as needed for pain and swelling.  Continue to use ice on the hip for pain and swelling from surgery. You may notice swelling that will progress down to the foot and ankle.  This is normal after surgery.  Elevate the leg when you are not up walking on it.    Continue to use the breathing machine which will help keep your temperature down.  It is common for your temperature to cycle up and down following surgery, especially at night when you are not up moving around and exerting yourself.  The breathing machine keeps your lungs expanded and your temperature down.  DIET You may resume your previous home diet once your are discharged from the hospital.  DRESSING / WOUND CARE / SHOWERING You may shower 3 days after surgery, but keep the wounds dry during showering.  You may use an occlusive plastic wrap (Press'n Seal for example), NO SOAKING/SUBMERGING IN THE BATHTUB.  If the bandage gets wet, change with a clean dry gauze.  If the incision gets wet, pat the wound dry with a clean towel. You may start showering once you are discharged home but do not submerge  the incision under water. Just pat the incision dry and apply a dry gauze dressing on daily. Change the surgical dressing daily and reapply a dry dressing each time.    ACTIVITY Walk with your walker as instructed. Use walker as long as suggested by your caregivers. Avoid periods of inactivity such as sitting longer than an hour when not asleep. This helps prevent blood clots.  You may resume a sexual relationship in one month or when given the OK by your doctor.  You may return to work once you are cleared by your doctor.  Do not drive a car for 6 weeks or until released by you surgeon.  Do not drive while taking narcotics.  WEIGHT BEARING Weight bearing as tolerated with assist device (walker, cane, etc) as directed, use it as long as suggested by your surgeon or therapist, typically at least 4-6 weeks.  POSTOPERATIVE CONSTIPATION PROTOCOL Constipation - defined medically as fewer than three stools per week and severe constipation as less than one stool per week.  One of the most common issues patients have following surgery is constipation.  Even if you have a regular bowel pattern at home, your normal regimen is likely to be disrupted due to multiple reasons following surgery.  Combination of anesthesia, postoperative narcotics, change in appetite and fluid intake all can affect your bowels.  In order to avoid complications following surgery, here are some recommendations in order to help you during your recovery period.  Colace (docusate) - Pick up an over-the-counter  form of Colace or another stool softener and take twice a day as long as you are requiring postoperative pain medications.  Take with a full glass of water daily.  If you experience loose stools or diarrhea, hold the colace until you stool forms back up.  If your symptoms do not get better within 1 week or if they get worse, check with your doctor. ° °Dulcolax (bisacodyl) - Pick up over-the-counter and take as directed by the  product packaging as needed to assist with the movement of your bowels.  Take with a full glass of water.  Use this product as needed if not relieved by Colace only.  ° °MiraLax (polyethylene glycol) - Pick up over-the-counter to have on hand.  MiraLax is a solution that will increase the amount of water in your bowels to assist with bowel movements.  Take as directed and can mix with a glass of water, juice, soda, coffee, or tea.  Take if you go more than two days without a movement. °Do not use MiraLax more than once per day. Call your doctor if you are still constipated or irregular after using this medication for 7 days in a row. ° °If you continue to have problems with postoperative constipation, please contact the office for further assistance and recommendations.  If you experience "the worst abdominal pain ever" or develop nausea or vomiting, please contact the office immediatly for further recommendations for treatment. ° °ITCHING ° If you experience itching with your medications, try taking only a single pain pill, or even half a pain pill at a time.  You can also use Benadryl over the counter for itching or also to help with sleep.  ° °TED HOSE STOCKINGS °Wear the elastic stockings on both legs for three weeks following surgery during the day but you may remove then at night for sleeping. ° °MEDICATIONS °See your medication summary on the “After Visit Summary” that the nursing staff will review with you prior to discharge.  You may have some home medications which will be placed on hold until you complete the course of blood thinner medication.  It is important for you to complete the blood thinner medication as prescribed by your surgeon.  Continue your approved medications as instructed at time of discharge. ° °PRECAUTIONS °If you experience chest pain or shortness of breath - call 911 immediately for transfer to the hospital emergency department.  °If you develop a fever greater that 101 F, purulent  drainage from wound, increased redness or drainage from wound, foul odor from the wound/dressing, or calf pain - CONTACT YOUR SURGEON.   °                                                °FOLLOW-UP APPOINTMENTS °Make sure you keep all of your appointments after your operation with your surgeon and caregivers. You should call the office at the above phone number and make an appointment for approximately two weeks after the date of your surgery or on the date instructed by your surgeon outlined in the "After Visit Summary". ° °RANGE OF MOTION AND STRENGTHENING EXERCISES  °These exercises are designed to help you keep full movement of your hip joint. Follow your caregiver's or physical therapist's instructions. Perform all exercises about fifteen times, three times per day or as directed. Exercise both hips, even if you   have had only one joint replacement. These exercises can be done on a training (exercise) mat, on the floor, on a table or on a bed. Use whatever works the best and is most comfortable for you. Use music or television while you are exercising so that the exercises are a pleasant break in your day. This will make your life better with the exercises acting as a break in routine you can look forward to.  Lying on your back, slowly slide your foot toward your buttocks, raising your knee up off the floor. Then slowly slide your foot back down until your leg is straight again.  Lying on your back spread your legs as far apart as you can without causing discomfort.  Lying on your side, raise your upper leg and foot straight up from the floor as far as is comfortable. Slowly lower the leg and repeat.  Lying on your back, tighten up the muscle in the front of your thigh (quadriceps muscles). You can do this by keeping your leg straight and trying to raise your heel off the floor. This helps strengthen the largest muscle supporting your knee.  Lying on your back, tighten up the muscles of your buttocks both  with the legs straight and with the knee bent at a comfortable angle while keeping your heel on the floor.      IF YOU ARE TRANSFERRED TO A SKILLED REHAB FACILITY If the patient is transferred to a skilled rehab facility following release from the hospital, a list of the current medications will be sent to the facility for the patient to continue.  When discharged from the skilled rehab facility, please have the facility set up the patient's Home Health Physical Therapy prior to being released. Also, the skilled facility will be responsible for providing the patient with their medications at time of release from the facility to include their pain medication, the muscle relaxants, and their blood thinner medication. If the patient is still at the rehab facility at time of the two week follow up appointment, the skilled rehab facility will also need to assist the patient in arranging follow up appointment in our office and any transportation needs.  MAKE SURE YOU:  Understand these instructions.  Get help right away if you are not doing well or get worse.    Pick up stool softner and laxative for home use following surgery while on pain medications. Do not submerge incision under water. Please use good hand washing techniques while changing dressing each day. May shower starting three days after surgery. Please use a clean towel to pat the incision dry following showers. Continue to use ice for pain and swelling after surgery. Do not use any lotions or creams on the incision until instructed by your surgeon.  Take Xarelto for two and a half more weeks following discharge from the hospital, then discontinue Xarelto. Once the patient has completed the blood thinner regimen, then take a Baby 81 mg Aspirin daily for three more weeks.    Information on my medicine - XARELTO (Rivaroxaban)  This medication education was reviewed with me or my healthcare representative as part of my discharge  preparation.   Why was Xarelto prescribed for you? Xarelto was prescribed for you to reduce the risk of blood clots forming after orthopedic surgery. The medical term for these abnormal blood clots is venous thromboembolism (VTE).  What do you need to know about xarelto ? Take your Xarelto ONCE DAILY at the same time every  day. You may take it either with or without food.  If you have difficulty swallowing the tablet whole, you may crush it and mix in applesauce just prior to taking your dose.  Take Xarelto exactly as prescribed by your doctor and DO NOT stop taking Xarelto without talking to the doctor who prescribed the medication.  Stopping without other VTE prevention medication to take the place of Xarelto may increase your risk of developing a clot.  After discharge, you should have regular check-up appointments with your healthcare provider that is prescribing your Xarelto.    What do you do if you miss a dose? If you miss a dose, take it as soon as you remember on the same day then continue your regularly scheduled once daily regimen the next day. Do not take two doses of Xarelto on the same day.   Important Safety Information A possible side effect of Xarelto is bleeding. You should call your healthcare provider right away if you experience any of the following: ? Bleeding from an injury or your nose that does not stop. ? Unusual colored urine (red or dark brown) or unusual colored stools (red or black). ? Unusual bruising for unknown reasons. ? A serious fall or if you hit your head (even if there is no bleeding).  Some medicines may interact with Xarelto and might increase your risk of bleeding while on Xarelto. To help avoid this, consult your healthcare provider or pharmacist prior to using any new prescription or non-prescription medications, including herbals, vitamins, non-steroidal anti-inflammatory drugs (NSAIDs) and supplements.  This website has more  information on Xarelto: [email or web address]

## 2017-05-05 NOTE — Anesthesia Preprocedure Evaluation (Addendum)
Anesthesia Evaluation  Patient identified by MRN, date of birth, ID band Patient awake    Reviewed: Allergy & Precautions, H&P , NPO status , Patient's Chart, lab work & pertinent test results  Airway Mallampati: II  TM Distance: >3 FB Neck ROM: full    Dental no notable dental hx. (+) Teeth Intact, Dental Advisory Given   Pulmonary neg pulmonary ROS,    Pulmonary exam normal breath sounds clear to auscultation       Cardiovascular hypertension, Pt. on medications Normal cardiovascular exam Rhythm:regular Rate:Normal     Neuro/Psych negative neurological ROS  negative psych ROS   GI/Hepatic negative GI ROS, Neg liver ROS,   Endo/Other  Morbid obesity  Renal/GU negative Renal ROS  negative genitourinary   Musculoskeletal   Abdominal (+) + obese,   Peds  Hematology negative hematology ROS (+) anemia ,   Anesthesia Other Findings   Reproductive/Obstetrics negative OB ROS                             Anesthesia Physical  Anesthesia Plan  ASA: III  Anesthesia Plan: General   Post-op Pain Management:    Induction: Intravenous  PONV Risk Score and Plan: 3 and 4 or greater and Ondansetron, Dexamethasone, Scopolamine patch - Pre-op and Diphenhydramine  Airway Management Planned: Oral ETT  Additional Equipment:   Intra-op Plan:   Post-operative Plan: Extubation in OR  Informed Consent: I have reviewed the patients History and Physical, chart, labs and discussed the procedure including the risks, benefits and alternatives for the proposed anesthesia with the patient or authorized representative who has indicated his/her understanding and acceptance.   Dental advisory given  Plan Discussed with: CRNA, Surgeon and Anesthesiologist  Anesthesia Plan Comments:         Anesthesia Quick Evaluation

## 2017-05-05 NOTE — Brief Op Note (Signed)
05/05/2017  5:48 PM  PATIENT:  [name]  56 y.o. female  PRE-OPERATIVE DIAGNOSIS:  Loose Left Total hip arthroplasty   POST-OPERATIVE DIAGNOSIS:  Loose Left Total hip arthroplasty   PROCEDURE:  Procedure(s): Left hip femoral versus total hip arthroplasty revision (Left)  SURGEON:  Surgeon(s) and Role:    Ollen Gross* Miyoko Hashimi, MD - Primary  PHYSICIAN ASSISTANT:   ASSISTANTS: Avel Peacerew Perkins, PA-C   ANESTHESIA:   general  EBL:  Total I/O In: 1630 [I.V.:1000; Blood:630] Out: 1575 [Urine:325; Blood:1250]  DRAINS: (Medium) Hemovact drain(s) in the Left hip with  Suction Open   LOCAL MEDICATIONS USED:  NON  COUNTS:  YES  TOURNIQUET:  * No tourniquets in log *  DICTATION: .Other Dictation: Dictation Number [id number]  PLAN OF CARE: Admit to inpatient   PATIENT DISPOSITION:  PACU - hemodynamically stable.

## 2017-05-05 NOTE — Anesthesia Postprocedure Evaluation (Signed)
Anesthesia Post Note  Patient: [name]  Procedure(s) Performed: Procedure(s) (LRB): Left hip femoral versus total hip arthroplasty revision (Left)     Patient location during evaluation: PACU Anesthesia Type: General Level of consciousness: sedated Pain management: pain level controlled Vital Signs Assessment: post-procedure vital signs reviewed and stable Respiratory status: spontaneous breathing and respiratory function stable Cardiovascular status: stable Anesthetic complications: no    Last Vitals:  Vitals:   05/05/17 1004  BP: 138/69  Pulse: 77  Resp: 18  Temp: 36.8 C    Last Pain:  Vitals:   05/05/17 1028  TempSrc:   PainSc: 3                  Emberlie Gotcher DANIEL

## 2017-05-05 NOTE — Anesthesia Procedure Notes (Signed)
Procedure Name: Intubation Date/Time: 05/05/2017 12:13 PM Performed by: Thornell MuleSTUBBLEFIELD, Leisa Gault G Pre-anesthesia Checklist: Patient identified, Emergency Drugs available, Suction available and Patient being monitored Patient Re-evaluated:Patient Re-evaluated prior to induction Oxygen Delivery Method: Circle system utilized Preoxygenation: Pre-oxygenation with 100% oxygen Induction Type: IV induction Ventilation: Mask ventilation without difficulty Laryngoscope Size: Miller and 3 Grade View: Grade I Tube type: Oral Tube size: 7.0 mm Number of attempts: 1 Airway Equipment and Method: Stylet and Oral airway Placement Confirmation: ETT inserted through vocal cords under direct vision,  positive ETCO2 and breath sounds checked- equal and bilateral Secured at: 21 cm Tube secured with: Tape Dental Injury: Teeth and Oropharynx as per pre-operative assessment

## 2017-05-05 NOTE — Addendum Note (Signed)
Addendum  created 05/05/17 1854 by Elisabeth CaraArmistead, Yahel Fuston A, CRNA   Anesthesia Event edited, Anesthesia Staff edited

## 2017-05-05 NOTE — Transfer of Care (Addendum)
Immediate Anesthesia Transfer of Care Note  Patient: [name]  Procedure(s) Performed: Procedure(s): Left hip femoral versus total hip arthroplasty revision (Left)  Patient Location: PACU  Anesthesia Type:General  Level of Consciousness: sedated  Airway & Oxygen Therapy: Patient Spontanous Breathing  Post-op Assessment: Report given to RN  Post vital signs: Reviewed  Last Vitals:  Vitals:   05/05/17 1004  BP: 138/69  Pulse: 77  Resp: 18  Temp: 36.8 C    Last Pain:  Vitals:   05/05/17 1028  TempSrc:   PainSc: 3       Patients Stated Pain Goal: 4 (05/05/17 1028)  Complications: No apparent anesthesia complications

## 2017-05-06 ENCOUNTER — Encounter (HOSPITAL_COMMUNITY): Payer: Self-pay | Admitting: Orthopedic Surgery

## 2017-05-06 ENCOUNTER — Inpatient Hospital Stay (HOSPITAL_COMMUNITY): Payer: 59

## 2017-05-06 LAB — BASIC METABOLIC PANEL
Anion gap: 7 (ref 5–15)
BUN: 11 mg/dL (ref 6–20)
CHLORIDE: 104 mmol/L (ref 101–111)
CO2: 27 mmol/L (ref 22–32)
Calcium: 8.6 mg/dL — ABNORMAL LOW (ref 8.9–10.3)
Creatinine, Ser: 0.78 mg/dL (ref 0.44–1.00)
GFR calc non Af Amer: >60 mL/min (ref >60)
Glucose, Bld: 128 mg/dL — ABNORMAL HIGH (ref 65–99)
POTASSIUM: 4.2 mmol/L (ref 3.5–5.1)
SODIUM: 138 mmol/L (ref 135–145)

## 2017-05-06 LAB — CBC
HCT: 31.8 % — ABNORMAL LOW (ref 36.0–46.0)
Hemoglobin: 10.4 g/dL — ABNORMAL LOW (ref 12.0–15.0)
MCH: 27.7 pg (ref 26.0–34.0)
MCHC: 32.7 g/dL (ref 30.0–36.0)
MCV: 84.8 fL (ref 78.0–100.0)
Platelets: 289 10*3/uL (ref 150–400)
RBC: 3.75 MIL/uL — AB (ref 3.87–5.11)
RDW: 15.2 % (ref 11.5–15.5)
WBC: 12.1 10*3/uL — AB (ref 4.0–10.5)

## 2017-05-06 LAB — TYPE AND SCREEN
ABO/RH(D): O POS
Antibody Screen: NEGATIVE
Unit division: 0
Unit division: 0

## 2017-05-06 LAB — BPAM RBC
BLOOD PRODUCT EXPIRATION DATE: 201808312359
Blood Product Expiration Date: 201808312359
ISSUE DATE / TIME: 201808081341
ISSUE DATE / TIME: 201808081341
UNIT TYPE AND RH: 5100
Unit Type and Rh: 5100

## 2017-05-06 MED ORDER — MENTHOL 3 MG MT LOZG: 1.0000 | LOZENGE | OROMUCOSAL | Status: DC | PRN

## 2017-05-06 MED ORDER — SUGAMMADEX SODIUM 500 MG/5ML IV SOLN
INTRAVENOUS | Status: DC | PRN
  Administered 2017-05-05: 350 mg via INTRAVENOUS

## 2017-05-06 NOTE — Evaluation (Signed)
Physical Therapy Evaluation Patient Details Name: [name] MRN: [id number] DOB: [birth year] Today's Date: 05/06/2017   History of Present Illness  s/p L THR femoral revision  Clinical Impression  Pt s/p L THR revision and presents with decreased L LE strength/ROM, post op pain, posterior THP and obesity limiting functional mobility.  Pt should progress to dc home with family assist and HHPT follow up.    Follow Up Recommendations Home health PT;DC plan and follow up therapy as arranged by surgeon    Equipment Recommendations  None recommended by PT    Recommendations for Other Services OT consult     Precautions / Restrictions Precautions Precautions: Posterior Hip Precaution Booklet Issued: Yes (comment) Precaution Comments: Reviewed x 2 Restrictions Weight Bearing Restrictions: No Other Position/Activity Restrictions: WBAT      Mobility  Bed Mobility               General bed mobility comments: OOB with OT  Transfers Overall transfer level: Needs assistance Equipment used: Rolling walker (2 wheeled) Transfers: Sit to/from Stand Sit to Stand: Min assist         General transfer comment: assist to steady. Cues for posterior THPs  Ambulation/Gait Ambulation/Gait assistance: Min assist Ambulation Distance (Feet): 80 Feet Assistive device: Rolling walker (2 wheeled) Gait Pattern/deviations: Step-to pattern;Step-through pattern;Decreased step length - right;Decreased step length - left;Shuffle;Trunk flexed Gait velocity: decr Gait velocity interpretation: Below normal speed for age/gender General Gait Details: cues for posture, position from RW and intial sequence.  Stairs            Wheelchair Mobility    Modified Rankin (Stroke Patients Only)       Balance Overall balance assessment: Needs assistance Sitting-balance support: No upper extremity supported;Feet supported Sitting balance-Leahy Scale: Good     Standing balance support:  Bilateral upper extremity supported Standing balance-Leahy Scale: Fair                               Pertinent Vitals/Pain Pain Assessment: 0-10 Pain Score: 6  Pain Location: L hip Pain Descriptors / Indicators: Aching Pain Intervention(s): Limited activity within patient's tolerance;Monitored during session;Premedicated before session;Ice applied    Home Living Family/patient expects to be discharged to:: Private residence Living Arrangements: Alone;Children Available Help at Discharge: Family Type of Home: House Home Access: Stairs to enter Entrance Stairs-Rails: Left Entrance Stairs-Number of Steps: 3 Home Layout: One level Home Equipment: Walker - 2 wheels;Cane - single point      Prior Function Level of Independence: Independent               Hand Dominance        Extremity/Trunk Assessment   Upper Extremity Assessment Upper Extremity Assessment: Overall WFL for tasks assessed    Lower Extremity Assessment Lower Extremity Assessment: LLE deficits/detail    Cervical / Trunk Assessment Cervical / Trunk Assessment: Normal  Communication   Communication: No difficulties  Cognition Arousal/Alertness: Awake/alert Behavior During Therapy: WFL for tasks assessed/performed Overall Cognitive Status: Within Functional Limits for tasks assessed                                        General Comments      Exercises     Assessment/Plan    PT Assessment Patient needs continued PT services  PT Problem List Decreased strength;Decreased range of  motion;Decreased activity tolerance;Decreased balance;Decreased mobility;Decreased knowledge of use of DME;Pain;Obesity       PT Treatment Interventions DME instruction;Gait training;Stair training;Functional mobility training;Therapeutic activities;Therapeutic exercise;Patient/family education    PT Goals (Current goals can be found in the Care Plan section)  Acute Rehab PT  Goals Patient Stated Goal: return to independence PT Goal Formulation: With patient Time For Goal Achievement: 05/11/17 Potential to Achieve Goals: Good    Frequency 7X/week   Barriers to discharge        Co-evaluation               AM-PAC PT "6 Clicks" Daily Activity  Outcome Measure Difficulty turning over in bed (including adjusting bedclothes, sheets and blankets)?: Total Difficulty moving from lying on back to sitting on the side of the bed? : Total Difficulty sitting down on and standing up from a chair with arms (e.g., wheelchair, bedside commode, etc,.)?: Total Help needed moving to and from a bed to chair (including a wheelchair)?: A Lot Help needed walking in hospital room?: A Little Help needed climbing 3-5 steps with a railing? : A Lot 6 Click Score: 10    End of Session   Activity Tolerance: Patient tolerated treatment well Patient left: in chair;with call bell/phone within reach Nurse Communication: Mobility status PT Visit Diagnosis: Difficulty in walking, not elsewhere classified (R26.2)    Time: [id number] PT Time Calculation (min) (ACUTE ONLY): 23 min   Charges:   PT Evaluation $PT Eval Low Complexity: 1 Low PT Treatments $Gait Training: 8-22 mins   PT G Codes:        Pg [phone]   Johnnathan Hagemeister 05/06/2017, 3:27 PM

## 2017-05-06 NOTE — Transfer of Care (Signed)
Immediate Anesthesia Transfer of Care Note  Patient: [name]  Procedure(s) Performed: Procedure(s): Left hip femoral arthroplasty revision (Left)  Patient Location: PACU  Anesthesia Type:Spinal  Level of Consciousness: drowsy and responds to stimulation  Airway & Oxygen Therapy: Patient Spontanous Breathing and Patient connected to face mask oxygen  Post-op Assessment: Report given to RN and Post -op Vital signs reviewed and stable  Post vital signs: Reviewed and stable  Last Vitals:  Vitals:   05/06/17 0215 05/06/17 0547  BP: 139/85 (!) 127/52  Pulse: 79 75  Resp: 18 18  Temp: 37 C 36.8 C    Last Pain:  Vitals:   05/06/17 0822  TempSrc:   PainSc: 4       Patients Stated Pain Goal: 2 (05/06/17 [id number])  Complications: No apparent anesthesia complications

## 2017-05-06 NOTE — Progress Notes (Signed)
   Subjective: 1 Day Post-Op Procedure(s) (LRB): Left hip femoral arthroplasty revision (Left) Patient reports pain as mild and moderate.   Patient seen in rounds with Dr. Lequita HaltAluisio. Patient is well, but has had some minor complaints of pain in the hip and thigh, requiring pain medications We will start therapy today.  Plan is to go Home after hospital stay.  Objective: Vital signs in last 24 hours: Temp:  [97.6 F (36.4 C)-98.6 F (37 C)] 98.3 F (36.8 C) (08/09 0547) Pulse Rate:  [60-85] 75 (08/09 0547) Resp:  [14-18] 18 (08/09 0547) BP: (127-160)/(52-97) 127/52 (08/09 0547) SpO2:  [98 %-100 %] 100 % (08/09 0547) Weight:  [154.2 kg (340 lb)] 154.2 kg (340 lb) (08/08 1028)  Intake/Output from previous day:  Intake/Output Summary (Last 24 hours) at 05/06/17 0838 Last data filed at 05/06/17 0824  Gross per 24 hour  Intake             3410 ml  Output             2290 ml  Net             1120 ml    Intake/Output this shift: Total I/O In: 240 [P.O.:240] Out: -   Labs:  Recent Labs  05/05/17 1340 05/06/17 0534  HGB 9.9* 10.4*    Recent Labs  05/05/17 1340 05/06/17 0534  WBC  --  12.1*  RBC  --  3.75*  HCT 29.0* 31.8*  PLT  --  289    Recent Labs  05/05/17 1340 05/06/17 0534  NA 138 138  K 3.4* 4.2  CL  --  104  CO2  --  27  BUN  --  11  CREATININE  --  0.78  GLUCOSE 124* 128*  CALCIUM  --  8.6*   No results for input(s): LABPT, INR in the last 72 hours.  EXAM General - Patient is Alert, Appropriate and Oriented Extremity - Neurovascular intact Sensation intact distally Intact pulses distally Dressing - dressing C/D/I Motor Function - intact, moving foot and toes well on exam.  Hemovac drain left in place for now  Past Medical History:  Diagnosis Date  . Anemia    hx of anemia  . Arthritis    OA LEFT HIP- SEVERE PAIN  . Asthma    as teen  . H/O seasonal allergies   . Hypertension   . Varicose veins     Assessment/Plan: 1 Day Post-Op  Procedure(s) (LRB): Left hip femoral arthroplasty revision (Left) Principal Problem:   Failed total hip arthroplasty (HCC) Active Problems:   H/O total hip arthroplasty  Estimated body mass index is 56.58 kg/m as calculated from the following:   Height as of this encounter: 5\' 5"  (1.651 m).   Weight as of this encounter: 154.2 kg (340 lb). Up with therapy Discharge home with home health  DVT Prophylaxis - Xarelto Weight Bearing As Tolerated left Leg D/C Knee Immobilizer Hemovac Pulled Begin Therapy Hip Preacutions  Avel Peacerew Celinda Dethlefs, PA-C Orthopaedic Surgery 05/06/2017, 8:38 AM

## 2017-05-06 NOTE — Progress Notes (Signed)
Discharge planning, spoke with patient at bedside. [facility]. Contacted [facility] for referral. Has RW, requesting 3n1, contacted [facility] to deliver to room. [phone]
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
## 2017-05-06 NOTE — Progress Notes (Signed)
Physical Therapy Treatment Patient Details Name: [name] MRN: [id number] DOB: 09/10/1961 Today's Date: 05/06/2017    History of Present Illness s/p L THR femoral revision    PT Comments    Pt very motivated and progressing steadily with mobility.  Pt hopeful for dc home Saturday.   Follow Up Recommendations  Home health PT;DC plan and follow up therapy as arranged by surgeon     Equipment Recommendations  None recommended by PT    Recommendations for Other Services OT consult     Precautions / Restrictions Precautions Precautions: Posterior Hip Precaution Booklet Issued: Yes (comment) Precaution Comments: Reviewed x 2 Restrictions Weight Bearing Restrictions: No Other Position/Activity Restrictions: WBAT    Mobility  Bed Mobility               General bed mobility comments: Pt OOB and requests back to chair  Transfers Overall transfer level: Needs assistance Equipment used: Rolling walker (2 wheeled) Transfers: Sit to/from Stand Sit to Stand: Min assist         General transfer comment: assist to steady. Cues for posterior THPs  Ambulation/Gait Ambulation/Gait assistance: Min assist;Min guard Ambulation Distance (Feet): 100 Feet (twice) Assistive device: Rolling walker (2 wheeled) Gait Pattern/deviations: Step-to pattern;Step-through pattern;Decreased step length - right;Decreased step length - left;Shuffle;Trunk flexed Gait velocity: decr Gait velocity interpretation: Below normal speed for age/gender General Gait Details: min cues for posture, position from RW, intial sequence and adherence to posterior THP with turns   Stairs            Wheelchair Mobility    Modified Rankin (Stroke Patients Only)       Balance Overall balance assessment: Needs assistance Sitting-balance support: No upper extremity supported;Feet supported Sitting balance-Leahy Scale: Good     Standing balance support: Bilateral upper extremity  supported Standing balance-Leahy Scale: Fair                              Cognition Arousal/Alertness: Awake/alert Behavior During Therapy: WFL for tasks assessed/performed Overall Cognitive Status: Within Functional Limits for tasks assessed                                        Exercises Total Joint Exercises Ankle Circles/Pumps: AROM;Both;15 reps;Supine    General Comments        Pertinent Vitals/Pain Pain Assessment: 0-10 Pain Score: 4  Pain Location: L hip Pain Descriptors / Indicators: Aching Pain Intervention(s): Limited activity within patient's tolerance;Monitored during session;Premedicated before session;Ice applied    Home Living Family/patient expects to be discharged to:: Private residence Living Arrangements: Alone;Children Available Help at Discharge: Family Type of Home: House Home Access: Stairs to enter Entrance Stairs-Rails: Left Home Layout: One level Home Equipment: Environmental consultant - 2 wheels;Cane - single point      Prior Function Level of Independence: Independent          PT Goals (current goals can now be found in the care plan section) Acute Rehab PT Goals Patient Stated Goal: return to independence PT Goal Formulation: With patient Time For Goal Achievement: 05/11/17 Potential to Achieve Goals: Good Progress towards PT goals: Progressing toward goals    Frequency    7X/week      PT Plan Current plan remains appropriate    Co-evaluation  AM-PAC PT "6 Clicks" Daily Activity  Outcome Measure  Difficulty turning over in bed (including adjusting bedclothes, sheets and blankets)?: Total Difficulty moving from lying on back to sitting on the side of the bed? : Total Difficulty sitting down on and standing up from a chair with arms (e.g., wheelchair, bedside commode, etc,.)?: Total Help needed moving to and from a bed to chair (including a wheelchair)?: A Lot Help needed walking in hospital  room?: A Little Help needed climbing 3-5 steps with a railing? : A Lot 6 Click Score: 10    End of Session   Activity Tolerance: Patient tolerated treatment well Patient left: in chair;with call bell/phone within reach Nurse Communication: Mobility status PT Visit Diagnosis: Difficulty in walking, not elsewhere classified (R26.2)     Time: 1447-1510 PT Time Calculation (min) (ACUTE ONLY): 23 min  Charges:  $Gait Training: 23-37 mins                    G Codes:       Pg [phone]    Clyda Smyth 05/06/2017, 3:37 PM

## 2017-05-06 NOTE — Addendum Note (Signed)
Addendum  created 05/06/17 0843 by Elyn PeersAllen, Matthan Sledge J, CRNA   Anesthesia Event edited, Anesthesia Intra Flowsheets edited, Anesthesia Intra Meds edited, Sign clinical note

## 2017-05-06 NOTE — Evaluation (Signed)
Occupational Therapy Evaluation Patient Details Name: Hannah PanderMarie Arenivas MRN: [id number] DOB: [birth year] Today's Date: 05/06/2017    History of Present Illness s/p L THR   Clinical Impression   This 56 year old female was admitted for the above.  At baseline, she is independent and works with preschoolers at [employer]  She will benefit from continued OT to increase safety and independence with adls following posterior THPs. Goals are for supervision level    Follow Up Recommendations  No OT follow up    Equipment Recommendations  3 in 1 bedside commode (wide)    Recommendations for Other Services       Precautions / Restrictions Precautions Precautions: Posterior Hip Precaution Booklet Issued: Yes (comment) Restrictions Weight Bearing Restrictions: No      Mobility Bed Mobility Overal bed mobility: Needs Assistance Bed Mobility: Supine to Sit     Supine to sit: Min assist;HOB elevated     General bed mobility comments: cues for sequence and assist for LLE  Transfers Overall transfer level: Needs assistance Equipment used: Rolling walker (2 wheeled) Transfers: Sit to/from Stand Sit to Stand: Min assist         General transfer comment: assist to steady. Cues for posterior THPs    Balance                                           ADL either performed or assessed with clinical judgement   ADL Overall ADL's : Needs assistance/impaired     Grooming: Wash/dry hands;Wash/dry face;Set up;Sitting   Upper Body Bathing: Set up;Sitting   Lower Body Bathing: Moderate assistance;Sit to/from stand   Upper Body Dressing : Minimal assistance;Sitting   Lower Body Dressing: Maximal assistance;Sit to/from stand   Toilet Transfer: Minimal assistance;Ambulation;BSC;RW   Toileting- Clothing Manipulation and Hygiene: Minimal assistance;Sit to/from stand         General ADL Comments: performed bathing from EOB, ambulated to bathroom and used commode.  Stood  and cleaned peri area.  Did not review AE this visit, but mentioned it. Will demonstrate this on next visit. Pt wants to be as independent as possible.       Vision         Perception     Praxis      Pertinent Vitals/Pain Pain Assessment: Faces Faces Pain Scale: Hurts even more Pain Location: L hip Pain Descriptors / Indicators: Aching Pain Intervention(s): Limited activity within patient's tolerance;Monitored during session;Premedicated before session;Repositioned     Hand Dominance     Extremity/Trunk Assessment Upper Extremity Assessment Upper Extremity Assessment: Overall WFL for tasks assessed           Communication Communication Communication: No difficulties   Cognition Arousal/Alertness: Awake/alert Behavior During Therapy: WFL for tasks assessed/performed Overall Cognitive Status: Within Functional Limits for tasks assessed                                     General Comments       Exercises     Shoulder Instructions      Home Living Family/patient expects to be discharged to:: Private residence Living Arrangements: Alone;Children Available Help at Discharge: Family                   Bathroom Toilet: Standard  Home Equipment: Walker - 2 wheels   Additional Comments: no bathroom DME.       Prior Functioning/Environment Level of Independence: Independent                 OT Problem List: Pain;Decreased knowledge of precautions;Decreased knowledge of use of DME or AE      OT Treatment/Interventions: Self-care/ADL training;DME and/or AE instruction;Patient/family education    OT Goals(Current goals can be found in the care plan section) Acute Rehab OT Goals Patient Stated Goal: return to independence OT Goal Formulation: With patient Time For Goal Achievement: 05/13/17 Potential to Achieve Goals: Good ADL Goals Pt Will Perform Lower Body Bathing: with supervision;sit to/from stand;with adaptive equipment Pt  Will Perform Lower Body Dressing: with supervision;with adaptive equipment;sit to/from stand Pt Will Transfer to Toilet: with supervision;ambulating;bedside commode Additional ADL Goal #1: pt will perform bed mobility with min A from flat bed Additional ADL Goal #2: pt will not need any cues for thps during adls/bathroom transfers  OT Frequency: Min 2X/week   Barriers to D/C:            Co-evaluation              AM-PAC PT "6 Clicks" Daily Activity     Outcome Measure Help from another person eating meals?: None Help from another person taking care of personal grooming?: A Little Help from another person toileting, which includes using toliet, bedpan, or urinal?: A Little Help from another person bathing (including washing, rinsing, drying)?: A Lot Help from another person to put on and taking off regular upper body clothing?: A Little Help from another person to put on and taking off regular lower body clothing?: A Lot 6 Click Score: 17   End of Session    Activity Tolerance: Patient tolerated treatment well Patient left: in chair;with call bell/phone within reach  OT Visit Diagnosis: Pain Pain - Right/Left: Left Pain - part of body: Hip                Time: 4098-1191 OT Time Calculation (min): 55 min Charges:  OT General Charges $OT Visit: 1 Procedure OT Evaluation $OT Eval Low Complexity: 1 Procedure OT Treatments $Self Care/Home Management : 38-52 mins G-Codes:     Willey, OTR/L [phone] 05/06/2017  Hayzen Lorenson 05/06/2017, 10:55 AM

## 2017-05-07 LAB — BASIC METABOLIC PANEL
Anion gap: 5 (ref 5–15)
BUN: 13 mg/dL (ref 6–20)
CALCIUM: 8.8 mg/dL — AB (ref 8.9–10.3)
CHLORIDE: 108 mmol/L (ref 101–111)
CO2: 28 mmol/L (ref 22–32)
Creatinine, Ser: 0.7 mg/dL (ref 0.44–1.00)
GLUCOSE: 108 mg/dL — AB (ref 65–99)
Potassium: 3.9 mmol/L (ref 3.5–5.1)
Sodium: 141 mmol/L (ref 135–145)

## 2017-05-07 LAB — CBC
HEMATOCRIT: 31.5 % — AB (ref 36.0–46.0)
HEMOGLOBIN: 10.1 g/dL — AB (ref 12.0–15.0)
MCH: 26.9 pg (ref 26.0–34.0)
MCHC: 32.1 g/dL (ref 30.0–36.0)
MCV: 83.8 fL (ref 78.0–100.0)
Platelets: 276 10*3/uL (ref 150–400)
RBC: 3.76 MIL/uL — ABNORMAL LOW (ref 3.87–5.11)
RDW: 15.3 % (ref 11.5–15.5)
WBC: 12.7 10*3/uL — ABNORMAL HIGH (ref 4.0–10.5)

## 2017-05-07 MED ORDER — METHOCARBAMOL 500 MG PO TABS: 500.0000 mg | ORAL_TABLET | Freq: Four times a day (QID) | ORAL | 0 refills | Status: AC | PRN

## 2017-05-07 MED ORDER — TRAMADOL HCL 50 MG PO TABS: 50.0000 mg | ORAL_TABLET | Freq: Four times a day (QID) | ORAL | 0 refills | Status: AC | PRN

## 2017-05-07 MED ORDER — RIVAROXABAN 10 MG PO TABS: 10.0000 mg | ORAL_TABLET | Freq: Every day | ORAL | 0 refills | Status: AC

## 2017-05-07 MED ORDER — OXYCODONE HCL 5 MG PO TABS: 5.0000 mg | ORAL_TABLET | ORAL | 0 refills | Status: AC | PRN

## 2017-05-07 NOTE — Progress Notes (Signed)
   Subjective: 2 Days Post-Op Procedure(s) (LRB): Left hip femoral arthroplasty revision (Left) Patient reports pain as mild.   Patient seen in rounds with Dr. Lequita HaltAluisio.  Little better today. She walked well with therapy yesterday, 80 feet and then 100 feet twice with PT. Patient is well, but has had some minor complaints of pain in the hip and thigh, requiring pain medications Patient is ready to go home later today following two sessions.  Objective: Vital signs in last 24 hours: Temp:  [98.1 F (36.7 C)-98.4 F (36.9 C)] 98.4 F (36.9 C) (08/10 0501) Pulse Rate:  [66-79] 66 (08/10 0501) Resp:  [16-19] 16 (08/10 0501) BP: (128-159)/(69-80) 159/80 (08/10 0501) SpO2:  [100 %] 100 % (08/10 0501)  Intake/Output from previous day:  Intake/Output Summary (Last 24 hours) at 05/07/17 0758 Last data filed at 05/07/17 0502  Gross per 24 hour  Intake             2248 ml  Output             3030 ml  Net             -782 ml    Intake/Output this shift: No intake/output data recorded.  Labs:  Recent Labs  05/05/17 1340 05/06/17 0534 05/07/17 0646  HGB 9.9* 10.4* 10.1*    Recent Labs  05/06/17 0534 05/07/17 0646  WBC 12.1* 12.7*  RBC 3.75* 3.76*  HCT 31.8* 31.5*  PLT 289 276    Recent Labs  05/06/17 0534 05/07/17 0646  NA 138 141  K 4.2 3.9  CL 104 108  CO2 27 28  BUN 11 13  CREATININE 0.78 0.70  GLUCOSE 128* 108*  CALCIUM 8.6* 8.8*   No results for input(s): LABPT, INR in the last 72 hours.  EXAM: General - Patient is Alert, Appropriate and Oriented Extremity - Neurovascular intact Sensation intact distally Intact pulses distally Dorsiflexion/Plantar flexion intact Incision - clean, dry, no drainage Motor Function - intact, moving foot and toes well on exam.   Assessment/Plan: 2 Days Post-Op Procedure(s) (LRB): Left hip femoral arthroplasty revision (Left) Procedure(s) (LRB): Left hip femoral arthroplasty revision (Left) Past Medical History:    Diagnosis Date  . Anemia    hx of anemia  . Arthritis    OA LEFT HIP- SEVERE PAIN  . Asthma    as teen  . H/O seasonal allergies   . Hypertension   . Varicose veins    Principal Problem:   Failed total hip arthroplasty (HCC) Active Problems:   H/O total hip arthroplasty  Estimated body mass index is 56.58 kg/m as calculated from the following:   Height as of this encounter: 5\' 5"  (1.651 m).   Weight as of this encounter: 154.2 kg (340 lb). Up with therapy Discharge home with home health Diet - Cardiac diet Follow up - in 2 weeks Activity - WBAT Disposition - Home Condition Upon Discharge - Stable D/C Meds - See DC Summary DVT Prophylaxis - Xarelto  Avel Peacerew Perkins, PA-C Orthopaedic Surgery 05/07/2017, 7:58 AM

## 2017-05-07 NOTE — Progress Notes (Signed)
Physical Therapy Treatment Patient Details Name: [name] MRN: [id number] DOB: [birth year] Today's Date: 05/07/2017    History of Present Illness s/p L THR femoral revision    PT Comments    Pt progressing steadily with mobility and motivated to regain IND.   Follow Up Recommendations  Home health PT;DC plan and follow up therapy as arranged by surgeon     Equipment Recommendations  None recommended by PT    Recommendations for Other Services OT consult     Precautions / Restrictions Precautions Precautions: Posterior Hip Precaution Comments: Pt recalls all THP Restrictions Weight Bearing Restrictions: No Other Position/Activity Restrictions: WBAT    Mobility  Bed Mobility Overal bed mobility: Needs Assistance Bed Mobility: Supine to Sit;Sit to Supine     Supine to sit: Min guard Sit to supine: Min guard   General bed mobility comments: Increased time with cues for sequence, use of R LE to self assist and adherence to posterior THP  Transfers Overall transfer level: Needs assistance Equipment used: Rolling walker (2 wheeled) Transfers: Sit to/from Stand Sit to Stand: Min guard;Supervision         General transfer comment: assist to steady. Cues for posterior THPs  Ambulation/Gait Ambulation/Gait assistance: Min guard;Supervision Ambulation Distance (Feet): 200 Feet (and 20' back from bathroom) Assistive device: Rolling walker (2 wheeled) Gait Pattern/deviations: Step-to pattern;Step-through pattern;Decreased step length - right;Decreased step length - left;Shuffle Gait velocity: decr Gait velocity interpretation: Below normal speed for age/gender General Gait Details: min cues for posture, position from RW, intial sequence and adherence to posterior THP with turns   Stairs            Wheelchair Mobility    Modified Rankin (Stroke Patients Only)       Balance                                            Cognition  Arousal/Alertness: Awake/alert Behavior During Therapy: WFL for tasks assessed/performed Overall Cognitive Status: Within Functional Limits for tasks assessed                                        Exercises Total Joint Exercises Ankle Circles/Pumps: AROM;Both;15 reps;Supine Quad Sets: AROM;Both;10 reps;Supine Heel Slides: AAROM;Left;Supine;15 reps Hip ABduction/ADduction: AAROM;Left;15 reps;Supine    General Comments        Pertinent Vitals/Pain Pain Assessment: 0-10 Pain Score: 5  Pain Location: L hip Pain Descriptors / Indicators: Aching;Sore Pain Intervention(s): Limited activity within patient's tolerance;Monitored during session;Premedicated before session;Ice applied    Home Living                      Prior Function            PT Goals (current goals can now be found in the care plan section) Acute Rehab PT Goals Patient Stated Goal: return to independence PT Goal Formulation: With patient Time For Goal Achievement: 05/11/17 Potential to Achieve Goals: Good Progress towards PT goals: Progressing toward goals    Frequency    7X/week      PT Plan Current plan remains appropriate    Co-evaluation              AM-PAC PT "6 Clicks" Daily Activity  Outcome Measure  Difficulty turning over in  bed (including adjusting bedclothes, sheets and blankets)?: Total Difficulty moving from lying on back to sitting on the side of the bed? : A Lot Difficulty sitting down on and standing up from a chair with arms (e.g., wheelchair, bedside commode, etc,.)?: A Lot Help needed moving to and from a bed to chair (including a wheelchair)?: A Little Help needed walking in hospital room?: A Little Help needed climbing 3-5 steps with a railing? : A Lot 6 Click Score: 13    End of Session   Activity Tolerance: Patient tolerated treatment well;Patient limited by fatigue Patient left: in bed;with call bell/phone within reach Nurse  Communication: Mobility status PT Visit Diagnosis: Difficulty in walking, not elsewhere classified (R26.2)     Time: [id number] PT Time Calculation (min) (ACUTE ONLY): 49 min  Charges:  $Gait Training: 8-22 mins $Therapeutic Exercise: 8-22 mins $Therapeutic Activity: 8-22 mins                    G Codes:       Pg [phone]    [name] 05/07/2017, 12:12 PM

## 2017-05-07 NOTE — Progress Notes (Signed)
RN reviewed discharge instructions with patient and all questions were answered. Nurse Tech rolled patient down to vehicle with patient's belongings.

## 2017-05-07 NOTE — Progress Notes (Signed)
Physical Therapy Treatment Patient Details Name: [name] MRN: [id number] DOB: [birth year] Today's Date: 05/07/2017    History of Present Illness s/p L THR femoral revision    PT Comments    Pt requiring increased time for all tasks but progressing steadily with mobility.  Reviewed stairs and car transfers.  Follow Up Recommendations  Home health PT;DC plan and follow up therapy as arranged by surgeon     Equipment Recommendations  None recommended by PT    Recommendations for Other Services OT consult     Precautions / Restrictions Precautions Precautions: Posterior Hip Precaution Comments: Pt recalls all THP Restrictions Weight Bearing Restrictions: No Other Position/Activity Restrictions: WBAT    Mobility  Bed Mobility Overal bed mobility: Needs Assistance Bed Mobility: Sit to Supine     Supine to sit: Min guard Sit to supine: Min guard   General bed mobility comments: used sheet to self assist  Transfers Overall transfer level: Needs assistance Equipment used: Rolling walker (2 wheeled) Transfers: Sit to/from Stand Sit to Stand: Supervision         General transfer comment: cues for LE placement  Ambulation/Gait Ambulation/Gait assistance: Min guard;Supervision Ambulation Distance (Feet): 50 Feet Assistive device: Rolling walker (2 wheeled) Gait Pattern/deviations: Step-to pattern;Step-through pattern;Decreased step length - right;Decreased step length - left;Shuffle Gait velocity: decr Gait velocity interpretation: Below normal speed for age/gender General Gait Details: min cues for posture, position from RW, intial sequence   Stairs Stairs: Yes   Stair Management: One rail Left;Step to pattern;Forwards;With cane Number of Stairs: 2 General stair comments: cues for sequence and foot/cane placement  Wheelchair Mobility    Modified Rankin (Stroke Patients Only)       Balance                                             Cognition Arousal/Alertness: Awake/alert Behavior During Therapy: WFL for tasks assessed/performed Overall Cognitive Status: Within Functional Limits for tasks assessed                                        Exercises      General Comments        Pertinent Vitals/Pain Pain Assessment: 0-10 Pain Score: 4  Pain Location: L hip Pain Descriptors / Indicators: Sore Pain Intervention(s): Limited activity within patient's tolerance;Monitored during session;Premedicated before session;Ice applied    Home Living                      Prior Function            PT Goals (current goals can now be found in the care plan section) Acute Rehab PT Goals Patient Stated Goal: return to independence PT Goal Formulation: With patient Time For Goal Achievement: 05/11/17 Potential to Achieve Goals: Good Progress towards PT goals: Progressing toward goals    Frequency    7X/week      PT Plan Current plan remains appropriate    Co-evaluation              AM-PAC PT "6 Clicks" Daily Activity  Outcome Measure  Difficulty turning over in bed (including adjusting bedclothes, sheets and blankets)?: Total Difficulty moving from lying on back to sitting on the side of the bed? : A Lot Difficulty  sitting down on and standing up from a chair with arms (e.g., wheelchair, bedside commode, etc,.)?: A Lot Help needed moving to and from a bed to chair (including a wheelchair)?: A Little Help needed walking in hospital room?: A Little Help needed climbing 3-5 steps with a railing? : A Lot 6 Click Score: 13    End of Session   Activity Tolerance: Patient tolerated treatment well;Patient limited by fatigue Patient left: in bed;with call bell/phone within reach Nurse Communication: Mobility status PT Visit Diagnosis: Difficulty in walking, not elsewhere classified (R26.2)     Time: [id number] PT Time Calculation (min) (ACUTE ONLY): 33 min  Charges:  $Gait  Training: 8-22 mins $Therapeutic Activity: 8-22 mins                    G Codes:       Pg [phone]    Rian Busche 05/07/2017, 5:26 PM
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
## 2017-05-07 NOTE — Progress Notes (Signed)
Occupational Therapy Treatment Patient Details Name: Shaianne Nucci MRN: [id number] DOB: Aug 14, 1961 Today's Date: 05/07/2017    History of present illness s/p L THR femoral revision   OT comments  All education completed. No further OT needs  Follow Up Recommendations       Equipment Recommendations       Recommendations for Other Services      Precautions / Restrictions Precautions Precautions: Posterior Hip Precaution Comments: Pt recalls all THP Restrictions Weight Bearing Restrictions: No Other Position/Activity Restrictions: WBAT       Mobility Bed Mobility Overal bed mobility: Needs Assistance Bed Mobility: Supine to Sit;Sit to Supine     Supine to sit: Min guard    General bed mobility comments: used shhet to assist  Transfers Overall transfer level: Needs assistance Equipment used: Rolling walker (2 wheeled) Transfers: Sit to/from Stand Sit to Stand: Supervision         General transfer comment: cues for LE placement    Balance                                           ADL either performed or assessed with clinical judgement   ADL                           Toilet Transfer: Supervision/safety;Ambulation;BSC;RW   Toileting- Clothing Manipulation and Hygiene: Supervision/safety;Sit to/from stand         General ADL Comments: reviewed shower transfer, but pt did not want to practice.  Reviewed AE use and pt purchased a kit. Stood at sink for bathing (RN came to finish up).  Educated on and used sheet to manage leg from flat bed.  Pt did grab onto bedrail at one point     Vision       Perception     Praxis      Cognition Arousal/Alertness: Awake/alert Behavior During Therapy: WFL for tasks assessed/performed Overall Cognitive Status: Within Functional Limits for tasks assessed                                          Exercises    Shoulder Instructions       General Comments       Pertinent Vitals/ Pain       Pain Assessment: 0-10 Pain Score: 4  Pain Location: L hip Pain Descriptors / Indicators: Sore Pain Intervention(s): Limited activity within patient's tolerance;Monitored during session;Premedicated before session;Repositioned  Home Living                                          Prior Functioning/Environment              Frequency           Progress Toward Goals  OT Goals(current goals can now be found in the care plan section)  Progress towards OT goals: Goals met/education completed, patient discharged from OT (verbalizes all)  Acute Rehab OT Goals Patient Stated Goal: return to independence  Plan      Co-evaluation                 AM-PAC PT "6 Clicks" Daily  Activity     Outcome Measure                    End of Session    OT Visit Diagnosis: Pain Pain - Right/Left: Left Pain - part of body: Hip   Activity Tolerance Patient tolerated treatment well   Patient Left  (at sink with NT)   Nurse Communication          Time: 7471-8550 OT Time Calculation (min): 30 min  Charges: OT General Charges $OT Visit: 1 Procedure OT Treatments $Self Care/Home Management : 8-22 mins $Therapeutic Activity: 8-22 mins  Lesle Chris, OTR/L [phone] 05/07/2017   [place] 05/07/2017, 3:50 PM

## 2017-05-11 ENCOUNTER — Encounter (HOSPITAL_COMMUNITY): Payer: Self-pay | Admitting: Orthopedic Surgery

## 2018-08-17 IMAGING — DX DG PORTABLE PELVIS
2 series · 2 of 2 positions shown · non-contrast
Comparison: 07/16/2014

CLINICAL DATA: Status post left hip arthroplasty.

EXAM:
PORTABLE PELVIS 1-2 VIEWS

[pelvis ap (1 of 2)]
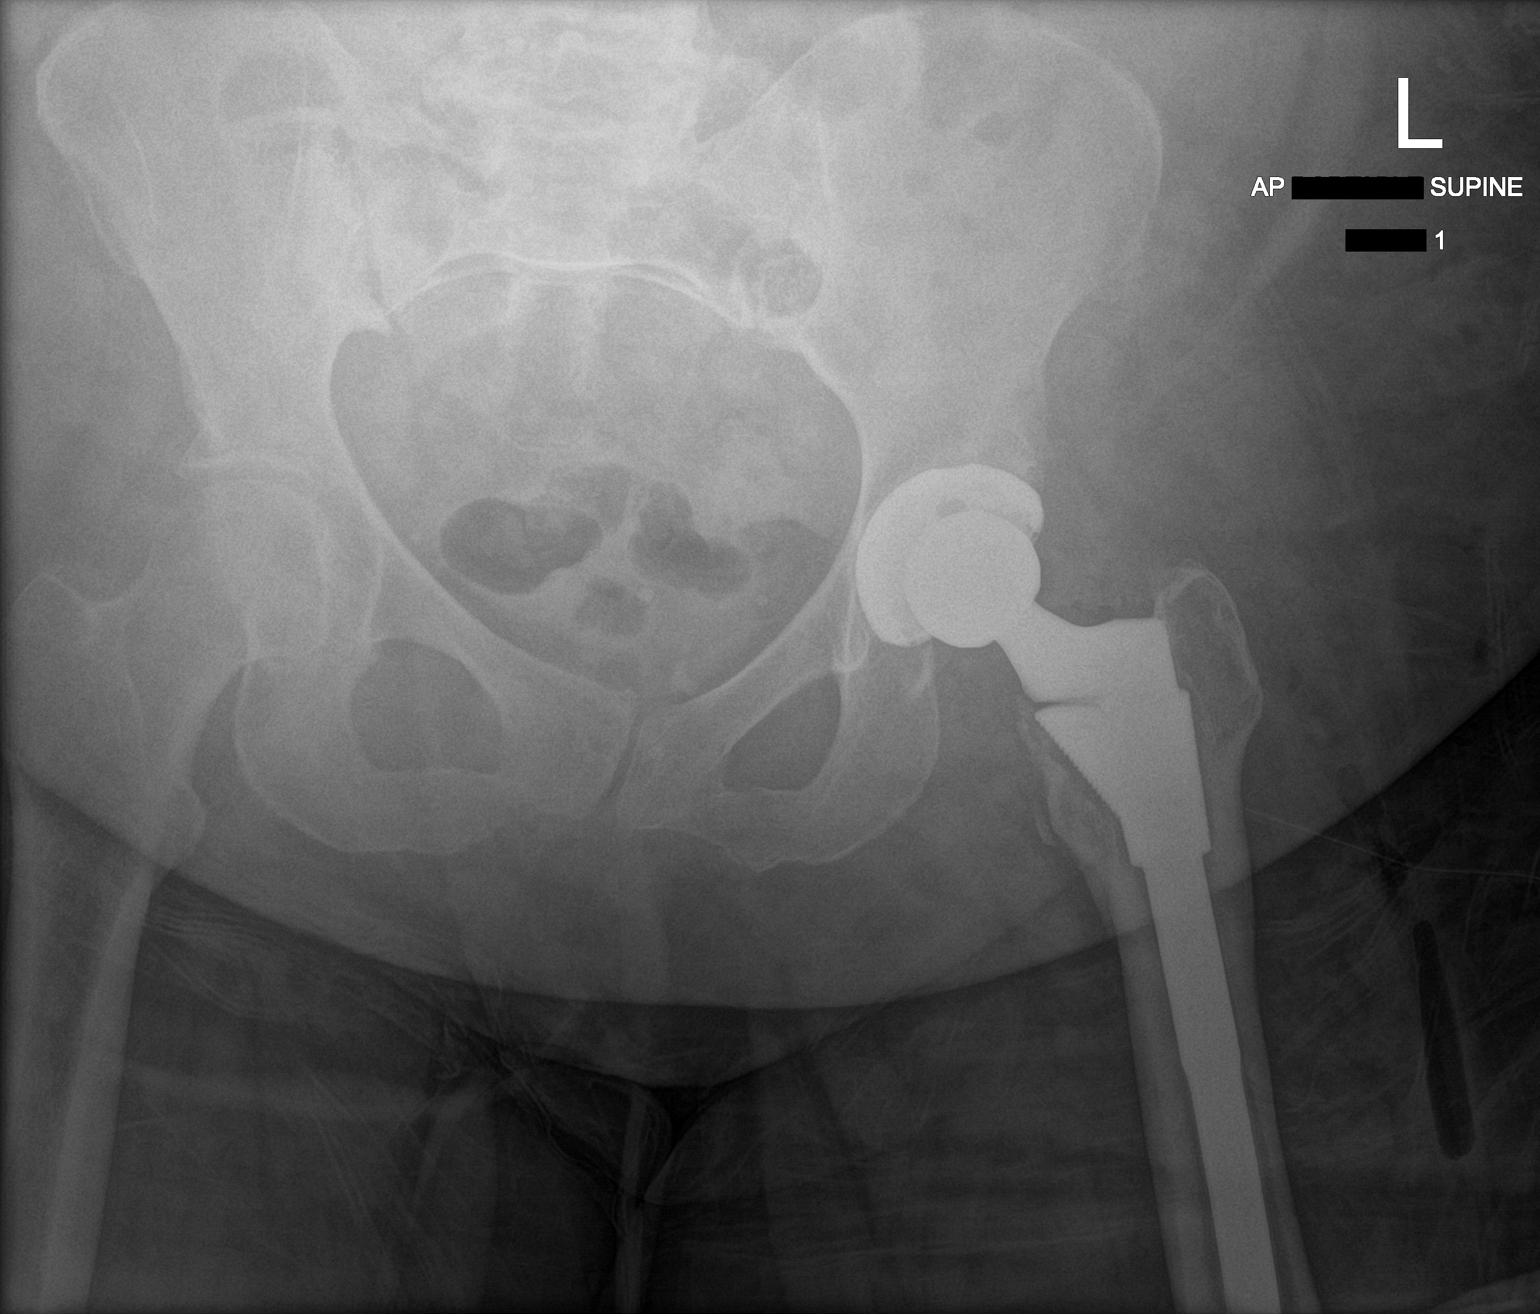

[pelvis ap (2 of 2)]
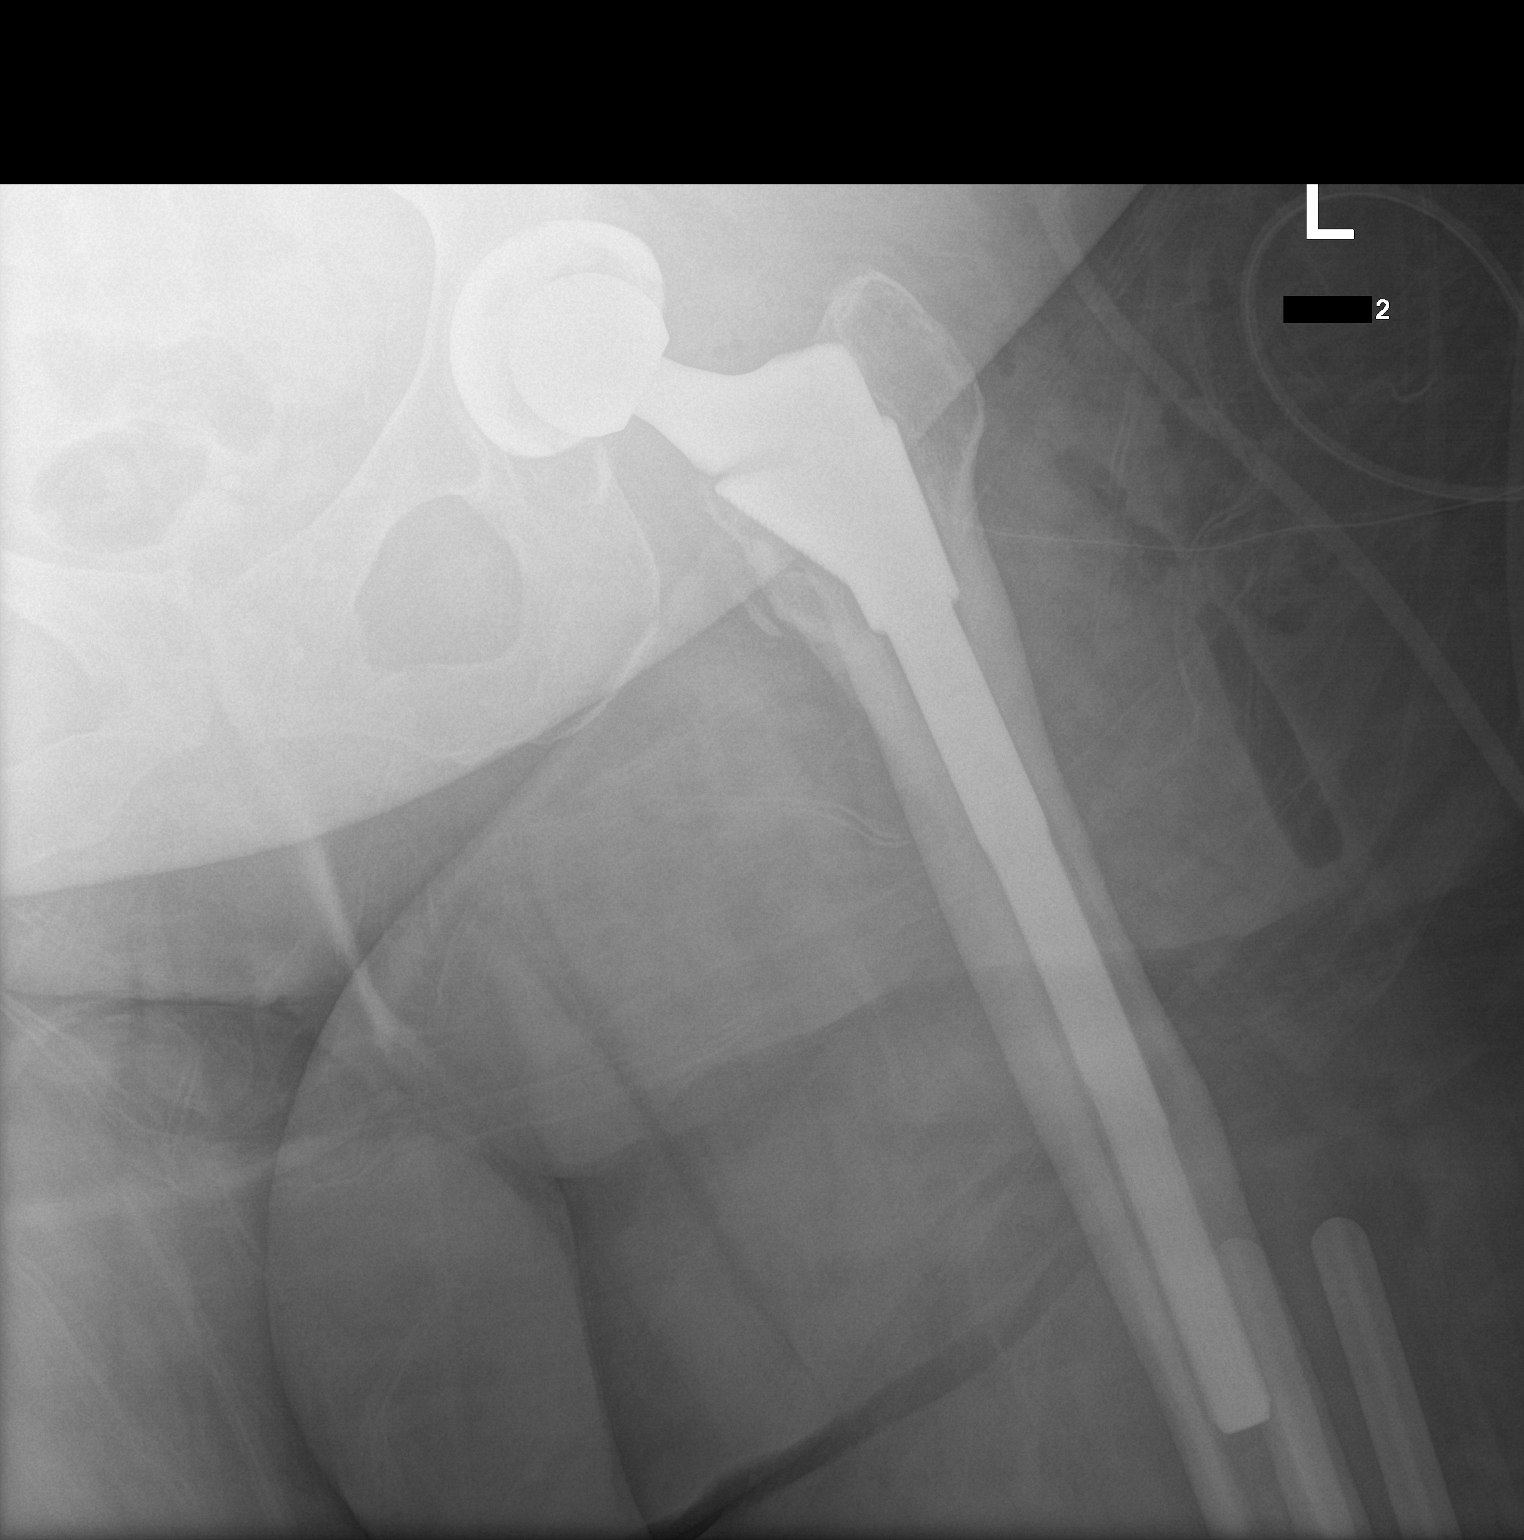

[2 of 2 positions shown; findings below may reference images not displayed]

FINDINGS: Left hip arthroplasty again identified. No periprosthetic fracture
or acute complication identified. Mild degradation secondary to
patient body habitus. Right femoral head located.
IMPRESSION: No acute findings.

## 2020-06-02 ENCOUNTER — Emergency Department (HOSPITAL_BASED_OUTPATIENT_CLINIC_OR_DEPARTMENT_OTHER)
Admission: EM | Admit: 2020-06-02 | Discharge: 2020-06-03 | Disposition: A | Discharge status: Home / Self Care | Payer: 59 | Attending: Emergency Medicine | Admitting: Emergency Medicine

## 2020-06-02 ENCOUNTER — Encounter (HOSPITAL_BASED_OUTPATIENT_CLINIC_OR_DEPARTMENT_OTHER): Payer: Self-pay | Admitting: Emergency Medicine

## 2020-06-02 ENCOUNTER — Other Ambulatory Visit: Payer: Self-pay

## 2020-06-02 DIAGNOSIS — M5442 Lumbago with sciatica, left side: Secondary | ICD-10-CM | POA: Insufficient documentation

## 2020-06-02 DIAGNOSIS — Z96642 Presence of left artificial hip joint: Secondary | ICD-10-CM | POA: Diagnosis not present

## 2020-06-02 DIAGNOSIS — M7918 Myalgia, other site: Secondary | ICD-10-CM | POA: Diagnosis present

## 2020-06-02 DIAGNOSIS — Y929 Unspecified place or not applicable: Secondary | ICD-10-CM | POA: Diagnosis not present

## 2020-06-02 DIAGNOSIS — Y939 Activity, unspecified: Secondary | ICD-10-CM | POA: Insufficient documentation

## 2020-06-02 DIAGNOSIS — J45909 Unspecified asthma, uncomplicated: Secondary | ICD-10-CM | POA: Diagnosis not present

## 2020-06-02 DIAGNOSIS — Y999 Unspecified external cause status: Secondary | ICD-10-CM | POA: Insufficient documentation

## 2020-06-02 DIAGNOSIS — Z79899 Other long term (current) drug therapy: Secondary | ICD-10-CM | POA: Insufficient documentation

## 2020-06-02 DIAGNOSIS — M5432 Sciatica, left side: Secondary | ICD-10-CM

## 2020-06-02 DIAGNOSIS — I10 Essential (primary) hypertension: Secondary | ICD-10-CM | POA: Diagnosis not present
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
## 2020-06-03 ENCOUNTER — Encounter (HOSPITAL_BASED_OUTPATIENT_CLINIC_OR_DEPARTMENT_OTHER): Payer: Self-pay | Admitting: Emergency Medicine

## 2020-06-03 ENCOUNTER — Emergency Department (HOSPITAL_BASED_OUTPATIENT_CLINIC_OR_DEPARTMENT_OTHER): Payer: 59

## 2020-06-03 MED ORDER — IBUPROFEN 800 MG PO TABS
800.0000 mg | ORAL_TABLET | Freq: Once | ORAL | Status: AC
  Administered 2020-06-03: 800 mg via ORAL
  Filled 2020-06-03: qty 1

## 2020-06-03 MED ORDER — ACETAMINOPHEN 500 MG PO TABS
1000.0000 mg | ORAL_TABLET | Freq: Once | ORAL | Status: AC
  Administered 2020-06-03: 1000 mg via ORAL
  Filled 2020-06-03: qty 2

## 2020-06-03 MED ORDER — LIDOCAINE 5 % EX PTCH
1.0000 | MEDICATED_PATCH | CUTANEOUS | Status: DC
  Administered 2020-06-03: 1 via TRANSDERMAL
  Filled 2020-06-03: qty 1

## 2020-06-03 MED ORDER — LIDOCAINE 5 % EX PTCH: 1.0000 | MEDICATED_PATCH | CUTANEOUS | 0 refills | Status: AC

## 2020-06-03 MED ORDER — METHOCARBAMOL 500 MG PO TABS: 500.0000 mg | ORAL_TABLET | Freq: Two times a day (BID) | ORAL | 0 refills | Status: AC

## 2020-06-03 MED ORDER — NAPROXEN 375 MG PO TABS: 375.0000 mg | ORAL_TABLET | Freq: Two times a day (BID) | ORAL | 0 refills | Status: AC
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
